# Patient Record
Sex: Female | Born: 1990 | Race: Black or African American | Hispanic: No | Marital: Single | Smoking: Current every day smoker
Health system: Southern US, Community
[De-identification: ages and names within clinical notes are randomized; demographics above are authoritative.]

## PROBLEM LIST (undated history)

## (undated) HISTORY — PX: WISDOM TOOTH EXTRACTION: SHX21

---

## 2014-04-07 ENCOUNTER — Encounter (HOSPITAL_BASED_OUTPATIENT_CLINIC_OR_DEPARTMENT_OTHER): Payer: Self-pay | Admitting: Emergency Medicine

## 2014-04-07 ENCOUNTER — Emergency Department (HOSPITAL_BASED_OUTPATIENT_CLINIC_OR_DEPARTMENT_OTHER): Payer: Self-pay

## 2014-04-07 DIAGNOSIS — W231XXA Caught, crushed, jammed, or pinched between stationary objects, initial encounter: Secondary | ICD-10-CM | POA: Insufficient documentation

## 2014-04-07 DIAGNOSIS — Y9389 Activity, other specified: Secondary | ICD-10-CM | POA: Insufficient documentation

## 2014-04-07 DIAGNOSIS — S6991XA Unspecified injury of right wrist, hand and finger(s), initial encounter: Secondary | ICD-10-CM | POA: Insufficient documentation

## 2014-04-07 DIAGNOSIS — Y998 Other external cause status: Secondary | ICD-10-CM | POA: Insufficient documentation

## 2014-04-07 DIAGNOSIS — Z720 Tobacco use: Secondary | ICD-10-CM | POA: Insufficient documentation

## 2014-04-07 DIAGNOSIS — Y9289 Other specified places as the place of occurrence of the external cause: Secondary | ICD-10-CM | POA: Insufficient documentation

## 2014-04-07 MED ORDER — IBUPROFEN 800 MG PO TABS: 800.0000 mg | ORAL_TABLET | Freq: Once | ORAL | Status: DC

## 2014-04-07 NOTE — ED Notes (Signed)
Pt reports struck elevator door with hand after hear about death of parent

## 2014-04-08 ENCOUNTER — Emergency Department (HOSPITAL_BASED_OUTPATIENT_CLINIC_OR_DEPARTMENT_OTHER)
Admission: EM | Admit: 2014-04-08 | Discharge: 2014-04-08 | Discharge status: Against Medical Advice | Payer: Self-pay | Attending: Emergency Medicine | Admitting: Emergency Medicine

## 2014-04-08 NOTE — ED Notes (Signed)
Pt asking how much longer for eval, informed md is looking at xrays that have been performed and seeing patients in fast track if xrays negative.  Pt and family after noted to be walking out of the er, nad, ambulatory.

## 2014-04-08 NOTE — ED Notes (Signed)
Call to room no answer.

## 2015-10-10 ENCOUNTER — Emergency Department (HOSPITAL_BASED_OUTPATIENT_CLINIC_OR_DEPARTMENT_OTHER)
Admission: EM | Admit: 2015-10-10 | Discharge: 2015-10-11 | Disposition: A | Discharge status: Home / Self Care | Payer: Self-pay | Attending: Emergency Medicine | Admitting: Emergency Medicine

## 2015-10-10 ENCOUNTER — Encounter (HOSPITAL_BASED_OUTPATIENT_CLINIC_OR_DEPARTMENT_OTHER): Payer: Self-pay | Admitting: Emergency Medicine

## 2015-10-10 DIAGNOSIS — H9201 Otalgia, right ear: Secondary | ICD-10-CM | POA: Insufficient documentation

## 2015-10-10 DIAGNOSIS — Z792 Long term (current) use of antibiotics: Secondary | ICD-10-CM | POA: Insufficient documentation

## 2015-10-10 DIAGNOSIS — F17200 Nicotine dependence, unspecified, uncomplicated: Secondary | ICD-10-CM | POA: Insufficient documentation

## 2015-10-10 MED ORDER — IBUPROFEN 800 MG PO TABS
800.0000 mg | ORAL_TABLET | Freq: Once | ORAL | Status: AC
  Administered 2015-10-11: 800 mg via ORAL
  Filled 2015-10-10: qty 1

## 2015-10-10 MED ORDER — LORATADINE 10 MG PO TABS: ORAL_TABLET | ORAL | 0 refills | Status: DC

## 2015-10-10 NOTE — ED Provider Notes (Signed)
[facility]   CSN: [id number] Arrival date & time: 10/10/15  2330  By signing my name below, I, Doreatha MartinEva Mathews, attest that this documentation has been prepared under the direction and in the presence of Zadie Rhineonald Bryanah Sidell, MD. Electronically Signed: Doreatha MartinEva Mathews, ED Scribe. 10/10/15. 11:54 PM.     History   Chief Complaint Chief Complaint  Patient presents with  . Otalgia    HPI Desiree House is a 25 y.o. female otherwise healthy who presents to the Emergency Department complaining of moderate right ear pain onset 2 days ago. She also notes some mild muffled hearing. Pt states she did not hear any pop before the onset of her pain, but felt a sudden sharp pain. She denies recent trauma or injury to the ear. Pt denies taking OTC medications at home to improve symptoms. She also denies drainage or bleeding from the ear, hearing loss, fever, emesis, cough, SOB, recent nasal congestion.   The history is provided by the patient. No language interpreter was used.  Otalgia  This is a new problem. The current episode started 2 days ago. There is pain in the right ear. The problem occurs constantly. The problem has not changed since onset.There has been no fever. The pain is moderate. Pertinent negatives include no ear discharge, no hearing loss, no vomiting and no cough. Her past medical history does not include hearing loss.    History reviewed. No pertinent past medical history.  There are no active problems to display for this patient.   History reviewed. No pertinent surgical history.  OB History    No data available       Home Medications    Prior to Admission medications   Medication Sig Start Date End Date Taking? Authorizing Provider  metroNIDAZOLE (FLAGYL) 500 MG tablet Take 500 mg by mouth 3 (three) times daily.   Yes Historical Provider, MD    Family History No family history on file.  Social History Social History  Substance Use Topics  .  Smoking status: Current Every Day Smoker  . Smokeless tobacco: Never Used  . Alcohol use Yes     Allergies   Review of patient's allergies indicates no known allergies.   Review of Systems Review of Systems  HENT: Positive for ear pain and tinnitus. Negative for congestion, ear discharge and hearing loss.   Respiratory: Negative for cough and shortness of breath.   Gastrointestinal: Negative for vomiting.     Physical Exam Updated Vital Signs BP 110/70 (BP Location: Left Arm)   Pulse 72   Temp 98.3 F (36.8 C) (Oral)   Resp 16   Ht 5\' 3"  (1.6 m)   Wt 126 lb (57.2 kg)   SpO2 100%   BMI 22.32 kg/m   Physical Exam CONSTITUTIONAL: Well developed/well nourished HEAD: Normocephalic/atraumatic EYES: EOMI/PERRL ENMT: Mucous membranes moist. No mastoid tenderness. Ears symmetric. Bilateral TMs intact. No erythema. Minimal effusion noted behind right TM  NECK: supple no meningeal signs CV: S1/S2 noted, no murmurs/rubs/gallops noted LUNGS: Lungs are clear to auscultation bilaterally, no apparent distress NEURO: Pt is awake/alert/appropriate, moves all extremitiesx4.  No facial droop.   SKIN: warm, color normal PSYCH: no abnormalities of mood noted, alert and oriented to situation   ED Treatments / Results   DIAGNOSTIC STUDIES: Oxygen Saturation is 100% on RA, normal by my interpretation.    COORDINATION OF CARE: 11:51 PM Discussed treatment plan with pt at bedside which includes symptomatic therapy and pt agreed to plan.  Labs (all labs ordered are listed, but only abnormal results are displayed) Labs Reviewed - No data to display  EKG  EKG Interpretation None       Radiology No results found.  Procedures Procedures   Medications Ordered in ED Medications - No data to display   Initial Impression / Assessment and Plan / ED Course  I have reviewed the triage vital signs and the nursing notes.  Pertinent labs & imaging results that were available  during my care of the patient were reviewed by me and considered in my medical decision making (see chart for details).  Clinical Course    Pt well appearing No hard signs of Otitis media/externa Exam otherwise unremarkable Advised OTC meds I did order claritin for any congestion that may be contributing to these symptoms  Final Clinical Impressions(s) / ED Diagnoses   Final diagnoses:  Otalgia, right    New Prescriptions Discharge Medication List as of 10/10/2015 11:57 PM    START taking these medications   Details  loratadine (CLARITIN) 10 MG tablet One po daily x 7 days, Print        I personally performed the services described in this documentation, which was scribed in my presence. The recorded information has been reviewed and is accurate.        Zadie Rhine, MD 10/11/15 [phone]

## 2015-10-10 NOTE — ED Triage Notes (Signed)
Pt c/o right ear pain x 2 days 

## 2015-10-10 NOTE — ED Notes (Signed)
MD at bedside. 

## 2016-12-28 ENCOUNTER — Other Ambulatory Visit: Payer: Self-pay

## 2016-12-28 ENCOUNTER — Emergency Department (HOSPITAL_BASED_OUTPATIENT_CLINIC_OR_DEPARTMENT_OTHER)
Admission: EM | Admit: 2016-12-28 | Discharge: 2016-12-28 | Disposition: A | Discharge status: Home / Self Care | Payer: Self-pay | Attending: Emergency Medicine | Admitting: Emergency Medicine

## 2016-12-28 ENCOUNTER — Encounter (HOSPITAL_BASED_OUTPATIENT_CLINIC_OR_DEPARTMENT_OTHER): Payer: Self-pay

## 2016-12-28 DIAGNOSIS — R197 Diarrhea, unspecified: Secondary | ICD-10-CM | POA: Insufficient documentation

## 2016-12-28 DIAGNOSIS — F17200 Nicotine dependence, unspecified, uncomplicated: Secondary | ICD-10-CM | POA: Insufficient documentation

## 2016-12-28 MED ORDER — LIDOCAINE (ANORECTAL) 5 % EX CREA: TOPICAL_CREAM | CUTANEOUS | 0 refills | Status: DC

## 2016-12-28 NOTE — ED Notes (Signed)
Pt went to restroom, given supplies to get a stool sample

## 2016-12-28 NOTE — ED Notes (Signed)
Pt came out of restroom, did not have a bowel movement, "just cramping."

## 2016-12-28 NOTE — ED Notes (Signed)
Came back to ED and gave stool sample as instructed and taken to lab

## 2016-12-28 NOTE — ED Provider Notes (Signed)
[facility] Provider Note: [name], MD, FACEP  CSN: [id number] MRN: [id number] ARRIVAL: 12/28/16 at 0056 ROOM: [facility]   CHIEF COMPLAINT  Diarrhea   HISTORY OF PRESENT ILLNESS  12/28/16 1:10 AM Desiree House is a 26 y.o. female with a 2-day history of diarrhea.  She describes the diarrhea as watery and profuse.  She is unable to specify how many times she has moved her bowels.  She was unable to move her bowels in the ED despite sitting in the bathroom for 15 minutes.  There has been associated abdominal cramping which is located in the lower abdomen.  She rates this cramping is a 7 out of 10 at its worst.  She denies fever, chills or dysuria.  She has had nausea but no vomiting.  She also complains of perianal irritation due to frequent bowel movements and frequent wiping.  She has taken 1 dose of Pepto-Bismol and 2 doses of loperamide without significant improvement yet.   History reviewed. No pertinent past medical history.  History reviewed. No pertinent surgical history.  No family history on file.  Social History   Tobacco Use  . Smoking status: Current Every Day Smoker  . Smokeless tobacco: Never Used  Substance Use Topics  . Alcohol use: Yes  . Drug use: No    Prior to Admission medications   Medication Sig Start Date End Date Taking? Authorizing Provider  loratadine (CLARITIN) 10 MG tablet One po daily x 7 days 10/10/15   Zadie RhineWickline, Donald, MD    Allergies Patient has no known allergies.   REVIEW OF SYSTEMS  Negative except as noted here or in the History of Present Illness.   PHYSICAL EXAMINATION  Initial Vital Signs Blood pressure (!) 138/92, pulse (!) 105, temperature 98.7 F (37.1 C), temperature source Oral, resp. rate 18, last menstrual period 12/12/2016, SpO2 99 %.  Examination General: Well-developed, well-nourished female in no acute distress; appearance consistent with age of record HENT: normocephalic; atraumatic Eyes:  pupils equal, round and reactive to light; extraocular muscles intact Neck: supple Heart: regular rate and rhythm Lungs: clear to auscultation bilaterally Abdomen: soft; nondistended; mild lower abdominal tenderness; no masses or hepatosplenomegaly; bowel sounds present Extremities: No deformity; full range of motion; pulses normal Neurologic: Awake, alert and oriented; motor function intact in all extremities and symmetric; no facial droop Skin: Warm and dry Psychiatric: Normal mood and affect   RESULTS  Summary of this visit's results, reviewed by myself:   EKG Interpretation  Date/Time:    Ventricular Rate:    PR Interval:    QRS Duration:   QT Interval:    QTC Calculation:   R Axis:     Text Interpretation:        Laboratory Studies: No results found for this or any previous visit (from the past 24 hour(s)). Imaging Studies: No results found.  ED COURSE  Nursing notes and initial vitals signs, including pulse oximetry, reviewed.  Vitals:   12/28/16 0107  BP: (!) 138/92  Pulse: (!) 105  Resp: 18  Temp: 98.7 F (37.1 C)  TempSrc: Oral  SpO2: 99%   She was advised to continue loperamide per package instructions.  We will attempt to have her return with the stool sample for analysis.  We will prescribe 5% lidocaine anorectal cream for symptomatic relief.  PROCEDURES    ED DIAGNOSES     ICD-10-CM   1. Diarrhea of presumed infectious origin R19.7        [name],  Jonny RuizJohn, MD 12/28/16 [id number]

## 2016-12-28 NOTE — ED Notes (Signed)
Pt verbalizes understanding of dc instructions.  Given collection kit for a stool sample and instructions on antidiarrheal medications as well as creams to relieve the pain in her rectal area from diarrhea. 

## 2016-12-28 NOTE — ED Triage Notes (Signed)
Pt c/o diarrhea and abdominal cramping since yesterday.  Tried one dose of pepto bismol yesterday and two doses of "antidiarrheal medicine" today, is unsure of what time this evening that she took the diarrhea medication.  Pt states she cannot count how many times she has had diarrhea.  Denies fevers and vomiting.

## 2016-12-29 LAB — GASTROINTESTINAL PANEL BY PCR, STOOL (REPLACES STOOL CULTURE)
Adenovirus F40/41: NOT DETECTED
Astrovirus: NOT DETECTED
Campylobacter species: NOT DETECTED
Cryptosporidium: NOT DETECTED
Cyclospora cayetanensis: NOT DETECTED
ENTAMOEBA HISTOLYTICA: NOT DETECTED
ENTEROAGGREGATIVE E COLI (EAEC): DETECTED — AB
ENTEROTOXIGENIC E COLI (ETEC): NOT DETECTED
Enteropathogenic E coli (EPEC): NOT DETECTED
GIARDIA LAMBLIA: NOT DETECTED
NOROVIRUS GI/GII: NOT DETECTED
Plesimonas shigelloides: NOT DETECTED
ROTAVIRUS A: NOT DETECTED
Salmonella species: DETECTED — AB
Sapovirus (I, II, IV, and V): NOT DETECTED
Shiga like toxin producing E coli (STEC): NOT DETECTED
Shigella/Enteroinvasive E coli (EIEC): NOT DETECTED
VIBRIO CHOLERAE: NOT DETECTED
Vibrio species: NOT DETECTED
Yersinia enterocolitica: NOT DETECTED

## 2017-01-01 ENCOUNTER — Telehealth (HOSPITAL_BASED_OUTPATIENT_CLINIC_OR_DEPARTMENT_OTHER): Payer: Self-pay | Admitting: Emergency Medicine

## 2017-01-18 ENCOUNTER — Encounter (HOSPITAL_BASED_OUTPATIENT_CLINIC_OR_DEPARTMENT_OTHER): Payer: Self-pay | Admitting: Emergency Medicine

## 2017-01-18 ENCOUNTER — Other Ambulatory Visit: Payer: Self-pay

## 2017-01-18 ENCOUNTER — Emergency Department (HOSPITAL_BASED_OUTPATIENT_CLINIC_OR_DEPARTMENT_OTHER)
Admission: EM | Admit: 2017-01-18 | Discharge: 2017-01-18 | Disposition: A | Discharge status: Home / Self Care | Payer: Self-pay | Attending: Emergency Medicine | Admitting: Emergency Medicine

## 2017-01-18 ENCOUNTER — Emergency Department (HOSPITAL_BASED_OUTPATIENT_CLINIC_OR_DEPARTMENT_OTHER): Payer: Self-pay

## 2017-01-18 DIAGNOSIS — B9789 Other viral agents as the cause of diseases classified elsewhere: Secondary | ICD-10-CM | POA: Insufficient documentation

## 2017-01-18 DIAGNOSIS — F17200 Nicotine dependence, unspecified, uncomplicated: Secondary | ICD-10-CM | POA: Insufficient documentation

## 2017-01-18 DIAGNOSIS — J069 Acute upper respiratory infection, unspecified: Secondary | ICD-10-CM | POA: Insufficient documentation

## 2017-01-18 LAB — RAPID STREP SCREEN (MED CTR MEBANE ONLY): Streptococcus, Group A Screen (Direct): NEGATIVE

## 2017-01-18 MED ORDER — FLUTICASONE PROPIONATE 50 MCG/ACT NA SUSP: 1.0000 | Freq: Every day | NASAL | 0 refills | Status: DC

## 2017-01-18 MED ORDER — PROMETHAZINE-DM 6.25-15 MG/5ML PO SYRP: 5.0000 mL | ORAL_SOLUTION | Freq: Four times a day (QID) | ORAL | 0 refills | Status: DC | PRN

## 2017-01-18 NOTE — ED Provider Notes (Signed)
[facility] EMERGENCY DEPARTMENT Provider Note   CSN: [id number] Arrival date & time: 01/18/17  1823     History   Chief Complaint Chief Complaint  Patient presents with  . Cough    HPI Desiree House is a 26 y.o. female presenting with cough.  Patient states that for the past 2 days, she has had a nonproductive cough and associated nasal congestion.  She denies fevers, chills, eye pain, ear pain, sore throat, chest pain, shortness of breath, nausea, vomiting, abdominal pain.  She states that she had a coworker who had a positive flu test.  She had a flu shot earlier this year.  She denies generalized body aches.  She has no other medical problems, does not take any medications daily.  She has not tried anything for her symptoms so far.  HPI  History reviewed. No pertinent past medical history.  There are no active problems to display for this patient.   History reviewed. No pertinent surgical history.  OB History    No data available       Home Medications    Prior to Admission medications   Medication Sig Start Date End Date Taking? Authorizing Provider  fluticasone (FLONASE) 50 MCG/ACT nasal spray Place 1 spray into both nostrils daily. 01/18/17   Daisja Kessinger, PA-C  Lidocaine, Anorectal, 5 % CREA Apply to perianal region as needed for discomfort. 12/28/16   Molpus, John, MD  promethazine-dextromethorphan (PROMETHAZINE-DM) 6.25-15 MG/5ML syrup Take 5 mLs by mouth 4 (four) times daily as needed for cough. 01/18/17   Laneya Gasaway, PA-C    Family History History reviewed. No pertinent family history.  Social History Social History   Tobacco Use  . Smoking status: Current Every Day Smoker  . Smokeless tobacco: Never Used  Substance Use Topics  . Alcohol use: Yes  . Drug use: No     Allergies   Patient has no known allergies.   Review of Systems Review of Systems  Constitutional: Negative for chills and fever.  HENT: Positive for  congestion. Negative for ear pain, sinus pressure, sinus pain and sore throat.   Respiratory: Positive for cough. Negative for chest tightness and shortness of breath.   Cardiovascular: Negative for chest pain.     Physical Exam Updated Vital Signs BP (!) 118/92 (BP Location: Left Arm)   Pulse 94   Temp 99 F (37.2 C) (Oral)   Resp 18   Ht 5\' 2"  (1.575 m)   Wt 57.2 kg (126 lb)   LMP 01/14/2017   SpO2 100%   BMI 23.05 kg/m   Physical Exam  Constitutional: She is oriented to person, place, and time. She appears well-developed and well-nourished. No distress.  HENT:  Head: Normocephalic and atraumatic.  Right Ear: Tympanic membrane, external ear and ear canal normal.  Left Ear: Tympanic membrane, external ear and ear canal normal.  Nose: Mucosal edema present. Right sinus exhibits maxillary sinus tenderness. Left sinus exhibits maxillary sinus tenderness.  Mouth/Throat: Uvula is midline, oropharynx is clear and moist and mucous membranes are normal. No tonsillar exudate.  Nasal mucosal edema.  Tenderness palpation of maxillary sinuses.  OP clear without tonsillar swelling or exudate.  Eyes: Conjunctivae and EOM are normal. Pupils are equal, round, and reactive to light.  Neck: Normal range of motion.  Cardiovascular: Normal rate, regular rhythm and intact distal pulses.  Pulmonary/Chest: Effort normal and breath sounds normal. No respiratory distress. She has no decreased breath sounds. She has no wheezes. She has  no rhonchi. She has no rales.  Pt speaking in full sentences. Clear lung sounds in all fields  Abdominal: Soft. She exhibits no distension. There is no tenderness.  Musculoskeletal: Normal range of motion.  Lymphadenopathy:    She has no cervical adenopathy.  Neurological: She is alert and oriented to person, place, and time.  Skin: Skin is warm.  Psychiatric: She has a normal mood and affect.  Nursing note and vitals reviewed.    ED Treatments / Results   Labs (all labs ordered are listed, but only abnormal results are displayed) Labs Reviewed  RAPID STREP SCREEN (NOT [facility])  CULTURE, GROUP A STREP [facility])    EKG  EKG Interpretation None       Radiology Dg Chest 2 View  Result Date: 01/18/2017 CLINICAL DATA:  Cough, chest pain and shortness of breath for 2 days. History of bronchitis. EXAM: CHEST  2 VIEW COMPARISON:  None. FINDINGS: Cardiomediastinal silhouette is normal. No pleural effusions or focal consolidations. Trachea projects midline and there is no pneumothorax. Soft tissue planes and included osseous structures are non-suspicious. IMPRESSION: Normal chest. Electronically Signed   By: Awilda Metroourtnay  Bloomer M.D.   On: 01/18/2017 19:28    Procedures Procedures (including critical care time)  Medications Ordered in ED Medications - No data to display   Initial Impression / Assessment and Plan / ED Course  I have reviewed the triage vital signs and the nursing notes.  Pertinent labs & imaging results that were available during my care of the patient were reviewed by me and considered in my medical decision making (see chart for details).     Patient presenting with URI symptoms.  Physical exam reassuring, patient is afebrile and appears nontoxic.  Pulmonary exam reassuring.  Doubt pneumonia, strep, other bacterial infection, or peritonsillar abscess. CXR and strep negative. Pt requesting flu swab, knowing that she will not get results today and it will not change management.   Likely URI.  Will treat symptomatically.  Patient to follow-up with primary care as needed.  At this time, patient appears safe for discharge.  Return precautions given.  Patient states they understand and agrees to plan.  RN reports patient declined flu swab when she realized it was a nasal swab.  Final Clinical Impressions(s) / ED Diagnoses   Final diagnoses:  Viral URI with cough    ED Discharge Orders        Ordered    fluticasone (FLONASE)  50 MCG/ACT nasal spray  Daily     01/18/17 2157    promethazine-dextromethorphan (PROMETHAZINE-DM) 6.25-15 MG/5ML syrup  4 times daily PRN     01/18/17 2157       Alveria ApleyCaccavale, Modine Oppenheimer, PA-C 01/19/17 0130    Melene PlanFloyd, Dan, DO 01/20/17 1534

## 2017-01-18 NOTE — ED Notes (Signed)
Pt refused flu swab at discharge, verbalizes understanding of dc instructions and denies any further needs at this time

## 2017-01-18 NOTE — ED Notes (Signed)
EDP is now in room

## 2017-01-18 NOTE — ED Triage Notes (Signed)
Patient states that for the last 2 days she has had a cough - she was exposed to someone with flu 2 days ago

## 2017-01-18 NOTE — Discharge Instructions (Signed)
You likely have a viral illness.  This will be treated symptomatically. Use Tylenol or ibuprofen as needed for fevers or body aches. Use Flonase daily for nasal congestion and cough. Take cough syrup as needed. Make sure you stay well-hydrated with water. Wash your hands frequently to prevent spread of infection. Follow-up with your primary care doctor in 1 week if your symptoms are not improving. Return to the emergency room if you develop chest pain, difficulty breathing, or any new or worsening symptoms.

## 2017-01-21 LAB — CULTURE, GROUP A STREP (THRC)

## 2018-01-24 ENCOUNTER — Emergency Department (HOSPITAL_BASED_OUTPATIENT_CLINIC_OR_DEPARTMENT_OTHER)
Admission: EM | Admit: 2018-01-24 | Discharge: 2018-01-24 | Disposition: A | Discharge status: Home / Self Care | Payer: Self-pay | Attending: Emergency Medicine | Admitting: Emergency Medicine

## 2018-01-24 ENCOUNTER — Encounter (HOSPITAL_BASED_OUTPATIENT_CLINIC_OR_DEPARTMENT_OTHER): Payer: Self-pay | Admitting: Emergency Medicine

## 2018-01-24 ENCOUNTER — Other Ambulatory Visit: Payer: Self-pay

## 2018-01-24 DIAGNOSIS — R059 Cough, unspecified: Secondary | ICD-10-CM

## 2018-01-24 DIAGNOSIS — R0981 Nasal congestion: Secondary | ICD-10-CM | POA: Insufficient documentation

## 2018-01-24 DIAGNOSIS — F17210 Nicotine dependence, cigarettes, uncomplicated: Secondary | ICD-10-CM | POA: Insufficient documentation

## 2018-01-24 DIAGNOSIS — R05 Cough: Secondary | ICD-10-CM | POA: Insufficient documentation

## 2018-01-24 MED ORDER — BENZONATATE 100 MG PO CAPS: 100.0000 mg | ORAL_CAPSULE | Freq: Three times a day (TID) | ORAL | 0 refills | Status: DC

## 2018-01-24 NOTE — Discharge Instructions (Signed)
At this time you appear to have a viral upper respiratory infection.  Treatment of symptoms is the best plan.  This includes tylenol or motrin for aches and pains, gargling with salt water for sore throat, increased fluid intake, especially hot drinks to soothe the throat.  Over the counter medications that include decongestants and cough suppressants may also help.  Expect to have symptoms for 5-10 days.  Return to the ER for worsening condition or new concerning symptoms. ° °1. Medications: tessalon for cough, usual home medications °2. Treatment: rest, drink plenty of fluids, take tylenol or ibuprofen for fever control °3. Follow Up: Please follow up with your primary doctor in 3 days for discussion of your diagnoses and further evaluation after today's visit; if you do not have a primary care doctor use the resource guide provided to find one; Return to the ER for high fevers, difficulty breathing or other concerning symptoms ° °Continue to stay well-hydrated. Gargle warm salt water and spit it out for sore throat. Take benadryl or other antihistamine to decrease secretions and for watery itchy eyes. Continued to alternate between Tylenol and ibuprofen for pain. Follow up with your primary care doctor in 5-7 days or referral for urgent care for recheck of ongoing symptoms however return to emergency department for emergent changing or worsening of symptoms. ° ° Read the instructions below on reasons to return to the emergency department and to learn more about your diagnosis.  Use over the counter medications for symptomatic relief as we discussed (mucinex as a decongestant, Tylenol for fever/pain, Motrin/Ibuprofen for muscle aches). If prescribed a cough suppressant during your visit, do not operate heavy machinery with in 5 hours of taking this medication. Follow up with your primary care doctor in 4 days if your symptoms persist.  Your more than welcome to return to the emergency department if symptoms worsen  or become concerning. ° °Upper Respiratory Infection, Adult  °An upper respiratory infection (URI) is also sometimes known as the common cold. Most people improve within 1 week, but symptoms can last up to 2 weeks. A residual cough may last even longer.  ° °URI is most commonly caused by a virus. Viruses are NOT treated with antibiotics. You can easily spread the virus to others by oral contact. This includes kissing, sharing a glass, coughing, or sneezing. Touching your mouth or nose and then touching a surface, which is then touched by another person, can also spread the virus.  ° °TREATMENT  °Treatment is directed at relieving symptoms. There is no cure. Antibiotics are not effective, because the infection is caused by a virus, not by bacteria. Treatment may include:  °Increased fluid intake. Sports drinks offer valuable electrolytes, sugars, and fluids.  °Breathing heated mist or steam (vaporizer or shower).  °Eating chicken soup or other clear broths, and maintaining good nutrition.  °Getting plenty of rest.  °Using gargles or lozenges for comfort.  °Controlling fevers with ibuprofen or acetaminophen as directed by your caregiver.  °Increasing usage of your inhaler if you have asthma.  °Return to work when your temperature has returned to normal.  ° °SEEK MEDICAL CARE IF:  °After the first few days, you feel you are getting worse rather than better.  °You develop worsening shortness of breath, or brown or red sputum. These may be signs of pneumonia.  °You develop yellow or brown nasal discharge or pain in the face, especially when you bend forward. These may be signs of sinusitis.  °You develop a   fever, swollen neck glands, pain with swallowing, or white areas in the back of your throat. These may be signs of strep throat.  Severe headache or stiff neck. Uncontrolled vomiting. Lightheadedness, feeling faint, or if you pass out. Problems breathing or shortness of breath. Weakness or inability to walk. Any  other concerning symptoms or concerns. If not improving over 7 days or if feeling worse at any time.  RESOURCE GUIDE  Dental Problems  Patients with Medicaid: Merna Lawrence Cisco Phone:  [phone]                                                                             Phone:  [phone]  If unable to pay or uninsured, contact:  [facility] or [facility]. to become qualified for the adult dental clinic.  Chronic Pain Problems Contact [facility]  [phone] Patients need to be referred by their primary care doctor.  Insufficient Money for Medicine Contact [employer]:  call "211" or [place] [phone].  No Primary Care Doctor Call [facility]  [phone] Other agencies that provide inexpensive medical care    [place]  [phone]    [facility]  [place]  [phone]    [facility]  [phone]    [facility]  [place]  [phone] [facility]  [phone] [place]   [phone] (emergency services [phone])  Abuse/Neglect [place] [phone] [place] [phone] (After Hours)  Emergency [place] [phone]  [place] at the [place] [phone] [place] [phone]  MRSA Hotline #:   (407)003-7778  [facility] Dept. [address]          [place]  Sela Hua Phone:  [phone]                                     Phone:  [phone]                   Phone:  [place] Phone:  [place] [phone] (After Hours)   If you develop symptoms of Shortness of Breath, Chest Pain, Swelling of lips, mouth or tongue or if your condition becomes worse with any new symptoms, see your doctor or return to the Emergency Department for immediate care. Emergency services are not intended to be a substitute for comprehensive medical attention.  Please contact your doctor for follow up if not improving as expected.   Call your doctor in 5-7 days or as directed if there is no improvement.   Community Resources: *IF YOU ARE IN IMMEDIATE DANGER CALL 911!  Abuse/Neglect:  Family Services Crisis Hotline [facility]): 639-815-8762 [facility] Against Violence [facility]): [phone]  After hours, holidays and weekends: [phone] National Domestic Violence Hotline: [place]: [place]: Dannielle Karvonen: [phone]  Health Clinics:  Urgent [place] ([place]): [phone] Monday - Friday 8 AM - 9 PM, Saturday and Sunday 10 AM - 9 PM  [facility] [place]: [phone] Monday - Friday 8 AM - 5 PM  [facility]  [address]: [phone] Monday- Friday 8:30 AM - 5:30 PM, Sat 9 AM - 1 PM  24 HR [place] [facility] [address]: [phone] [facility] [address]: [phone] [facility] [address]: [phone]  24 [facility] [place] [facility]: [address] [phone]  Cultures: If culture results are positive, we will notify you if a change in treatment is necessary.  LABORATORY TESTS:         If you had any labs drawn in the ED that have not resulted by the time you are  discharged home, we will review these lab results and the treatment given to you.  If there is any further treatment or notification needed, we will contact you by phone, or letter.  "PLEASE ENSURE THAT YOU HAVE GIVEN US YOUR CURRENT WORKING PHONE NUMBER AND YOUR CURRENT ADDRESS, so that we can contact you if needed."  RADIOLOGY TESTS:  If the referred physician wants todays x-rays, please call the hospitals Radiology Department the day before your doctors appointment. [place]     [phone] [place]   [phone] [place]     [phone]  Our doctors and staff appreciate your choosing [place] for your emergency medical care needs. We are here to serve you.

## 2018-01-24 NOTE — ED Triage Notes (Signed)
Pt c/o coughing x 1 week. Pt tried over the counter medication without relief.

## 2018-01-24 NOTE — ED Provider Notes (Signed)
[facility] EMERGENCY DEPARTMENT Provider Note   CSN: [id number] Arrival date & time: 01/24/18  1835     History   Chief Complaint Chief Complaint  Patient presents with  . Cough    HPI Desiree House is a 27 y.o. female presents with an intermittent productive cough, sneezing, congestion, and rhinorrhea onset 1 week ago. Patient states she has tried multiple over the counter remedies without relief. Patient states nothing makes the pain better. Patient states she had a fever initially, but fever has resolved. Patient reports intermittent chest tightness with coughing. Patient denies sore throat, body aches, abdominal pain, nausea, vomiting, or diarrhea. Patient denies shortness of breath or chest pain. Patient reports sick exposures at work. Patient states symptoms have improved over time.   HPI  History reviewed. No pertinent past medical history.  There are no active problems to display for this patient.   Past Surgical History:  Procedure Laterality Date  . WISDOM TOOTH EXTRACTION       OB History   No obstetric history on file.      Home Medications    Prior to Admission medications   Medication Sig Start Date End Date Taking? Authorizing Provider  benzonatate (TESSALON) 100 MG capsule Take 1 capsule (100 mg total) by mouth every 8 (eight) hours. 01/24/18   Carlyle BasquesHernandez, Fabiana Dromgoole P, PA-C  fluticasone (FLONASE) 50 MCG/ACT nasal spray Place 1 spray into both nostrils daily. 01/18/17   Caccavale, Sophia, PA-C  Lidocaine, Anorectal, 5 % CREA Apply to perianal region as needed for discomfort. 12/28/16   Molpus, John, MD  promethazine-dextromethorphan (PROMETHAZINE-DM) 6.25-15 MG/5ML syrup Take 5 mLs by mouth 4 (four) times daily as needed for cough. 01/18/17   Caccavale, Sophia, PA-C    Family History No family history on file.  Social History Social History   Tobacco Use  . Smoking status: Current Every Day Smoker  . Smokeless tobacco: Never Used    Substance Use Topics  . Alcohol use: Yes  . Drug use: No     Allergies   Patient has no known allergies.   Review of Systems Review of Systems  Constitutional: Negative for chills, diaphoresis and fever.  HENT: Positive for congestion and rhinorrhea. Negative for ear pain and sore throat.   Eyes: Negative for pain.  Respiratory: Positive for cough and chest tightness. Negative for shortness of breath.   Cardiovascular: Negative for chest pain.  Gastrointestinal: Negative for abdominal pain, nausea and vomiting.  Endocrine: Negative for cold intolerance and heat intolerance.  Genitourinary: Negative for dysuria.  Musculoskeletal: Negative for back pain.  Skin: Negative for rash.  Allergic/Immunologic: Negative for immunocompromised state.  Neurological: Negative for headaches.  Hematological: Negative for adenopathy.     Physical Exam Updated Vital Signs BP 123/83 (BP Location: Right Arm)   Pulse 90   Temp 99 F (37.2 C) (Oral)   Resp 20   Ht 5\' 2"  (1.575 m)   Wt 77.1 kg   LMP 12/16/2017   SpO2 100%   BMI 31.09 kg/m   Physical Exam Vitals signs and nursing note reviewed.  Constitutional:      General: She is not in acute distress.    Appearance: She is well-developed. She is not diaphoretic.  HENT:     Head: Normocephalic and atraumatic.     Right Ear: Tympanic membrane, ear canal and external ear normal.     Left Ear: Tympanic membrane, ear canal and external ear normal.     Nose: Congestion and  rhinorrhea present.     Mouth/Throat:     Pharynx: Posterior oropharyngeal erythema present. No oropharyngeal exudate.  Neck:     Musculoskeletal: Normal range of motion and neck supple.  Cardiovascular:     Rate and Rhythm: Normal rate and regular rhythm.     Heart sounds: Normal heart sounds. No murmur. No friction rub. No gallop.   Pulmonary:     Effort: Pulmonary effort is normal. No respiratory distress.     Breath sounds: Normal breath sounds. No wheezing  or rales.  Abdominal:     Palpations: Abdomen is soft.     Tenderness: There is no abdominal tenderness.  Musculoskeletal: Normal range of motion.  Skin:    General: Skin is warm.     Findings: No erythema or rash.  Neurological:     Mental Status: She is alert.      ED Treatments / Results  Labs (all labs ordered are listed, but only abnormal results are displayed) Labs Reviewed - No data to display  EKG None  Radiology No results found.  Procedures Procedures (including critical care time)  Medications Ordered in ED Medications - No data to display   Initial Impression / Assessment and Plan / ED Course  I have reviewed the triage vital signs and the nursing notes.  Pertinent labs & imaging results that were available during my care of the patient were reviewed by me and considered in my medical decision making (see chart for details).    Patients symptoms are consistent with URI, likely viral etiology. Discussed that antibiotics are not indicated for viral infections. Pt will be discharged with symptomatic treatment.  Verbalizes understanding and is agreeable with plan. Advised patient to return if she experiences new or worsening symptoms. Pt is hemodynamically stable & in NAD prior to dc.  Final Clinical Impressions(s) / ED Diagnoses   Final diagnoses:  Cough    ED Discharge Orders         Ordered    benzonatate (TESSALON) 100 MG capsule  Every 8 hours     01/24/18 2112           Leretha DykesHernandez, Gervis Gaba P, [place] JerseyPA-C 01/24/18 2114    Gwyneth SproutPlunkett, Whitney, MD 01/25/18 [phone]

## 2019-04-02 ENCOUNTER — Other Ambulatory Visit: Payer: Self-pay

## 2019-04-02 ENCOUNTER — Emergency Department (HOSPITAL_BASED_OUTPATIENT_CLINIC_OR_DEPARTMENT_OTHER)
Admission: EM | Admit: 2019-04-02 | Discharge: 2019-04-02 | Disposition: A | Discharge status: Home / Self Care | Payer: HRSA Program | Attending: Emergency Medicine | Admitting: Emergency Medicine

## 2019-04-02 ENCOUNTER — Encounter (HOSPITAL_BASED_OUTPATIENT_CLINIC_OR_DEPARTMENT_OTHER): Payer: Self-pay | Admitting: *Deleted

## 2019-04-02 ENCOUNTER — Emergency Department (HOSPITAL_BASED_OUTPATIENT_CLINIC_OR_DEPARTMENT_OTHER): Payer: HRSA Program

## 2019-04-02 DIAGNOSIS — U071 COVID-19: Secondary | ICD-10-CM | POA: Insufficient documentation

## 2019-04-02 DIAGNOSIS — Z20822 Contact with and (suspected) exposure to covid-19: Secondary | ICD-10-CM

## 2019-04-02 DIAGNOSIS — F17210 Nicotine dependence, cigarettes, uncomplicated: Secondary | ICD-10-CM | POA: Insufficient documentation

## 2019-04-02 DIAGNOSIS — R05 Cough: Secondary | ICD-10-CM | POA: Diagnosis present

## 2019-04-02 DIAGNOSIS — J069 Acute upper respiratory infection, unspecified: Secondary | ICD-10-CM

## 2019-04-02 LAB — SARS CORONAVIRUS 2 (TAT 6-24 HRS): SARS Coronavirus 2: POSITIVE — AB

## 2019-04-02 LAB — SARS CORONAVIRUS 2 AG (30 MIN TAT): SARS Coronavirus 2 Ag: NEGATIVE

## 2019-04-02 LAB — PREGNANCY, URINE: Preg Test, Ur: NEGATIVE

## 2019-04-02 MED ORDER — BENZONATATE 100 MG PO CAPS: 100.0000 mg | ORAL_CAPSULE | Freq: Three times a day (TID) | ORAL | 0 refills | Status: DC

## 2019-04-02 NOTE — Discharge Instructions (Signed)
You have been diagnosed today with viral upper respiratory tract infection with cough, suspected COVID-19 viral infection.  At this time there does not appear to be the presence of an emergent medical condition, however there is always the potential for conditions to change. Please read and follow the below instructions.  Please return to the Emergency Department immediately for any new or worsening symptoms. Please be sure to follow up with your Primary Care Provider within one week regarding your visit today; please call their office to schedule an appointment even if you are feeling better for a follow-up visit. Your Covid test is currently pending please check your results on your MyChart account in 1-2 days and discuss them with your primary care provider and schedule a follow-up visit.  Please drink plenty of water and get plenty of rest.  Please continue to quarantine until 10 days after symptom onset.  Wear mask to avoid spread of infection.  Get help right away if: You have shortness of breath You have very bad or constant: Headache. Ear pain. Pain in your forehead, behind your eyes, and over your cheekbones (sinus pain). Chest pain. You have long-lasting (chronic) lung disease along with any of these: Wheezing. Long-lasting cough. Coughing up blood. A change in your usual mucus. You have a stiff neck. You have changes in your: Vision. Hearing. Thinking. Mood. You have any new/concerning or worsening symptoms.  Please read the additional information packets attached to your discharge summary.  Do not take your medicine if  develop an itchy rash, swelling in your mouth or lips, or difficulty breathing; call 911 and seek immediate emergency medical attention if this occurs.  Note: Portions of this text may have been transcribed using voice recognition software. Every effort was made to ensure accuracy; however, inadvertent computerized transcription errors may still be  present.

## 2019-04-02 NOTE — ED Triage Notes (Signed)
C/o cough x 3 days productive clear mucus, started as sneezing and runny nose  Then cough developed denies fever, no generalized body ache

## 2019-04-02 NOTE — ED Provider Notes (Signed)
[facility] EMERGENCY DEPARTMENT Provider Note   CSN: [id number] Arrival date & time: 04/02/19  1962     History Chief Complaint  Patient presents with  . Cough    Desiree House is a 29 y.o. female otherwise healthy no daily medication use presents today for a 3-day history of URI type symptoms.  She reports that her boyfriend was sick last week with a similar illness.  3 days ago she developed sneezing and clear rhinorrhea.  She reports the sneezing ceased after 1 day and rhinorrhea continued, over the past 2 days she has developed a mild cough productive with clear mucus.  She denies any other symptoms.  Denies fever/chills, headache, sore throat, neck pain/stiffness, facial swelling, chest pain/shortness of breath, hemoptysis, abdominal pain, nausea/vomiting, diarrhea, dysuria/hematuria, extremity swelling/color change or additional concerns. HPI     History reviewed. No pertinent past medical history.  There are no problems to display for this patient.   Past Surgical History:  Procedure Laterality Date  . WISDOM TOOTH EXTRACTION       OB History   No obstetric history on file.     No family history on file.  Social History   Tobacco Use  . Smoking status: Current Every Day Smoker  . Smokeless tobacco: Never Used  Substance Use Topics  . Alcohol use: Yes  . Drug use: No    Home Medications Prior to Admission medications   Medication Sig Start Date End Date Taking? Authorizing Provider  Lidocaine, Anorectal, 5 % CREA Apply to perianal region as needed for discomfort. 12/28/16  Yes [provider]  benzonatate (TESSALON) 100 MG capsule Take 1 capsule (100 mg total) by mouth every 8 (eight) hours. 04/02/19   Bill Salinas, PA-C    Allergies    Patient has no known allergies.  Review of Systems   Review of Systems Ten systems are reviewed and are negative for acute change except as noted in the HPI  Physical Exam Updated Vital  Signs BP 124/74 (BP Location: Right Arm)   Pulse 92   Temp 98.3 F (36.8 C) (Oral)   Resp 18   Ht 5\' 3"  (1.6 m)   Wt 78.5 kg   SpO2 99%   BMI 30.65 kg/m   Physical Exam Constitutional:      General: She is not in acute distress.    Appearance: Normal appearance. She is well-developed. She is not ill-appearing or diaphoretic.  HENT:     Head: Normocephalic and atraumatic.     Jaw: There is normal jaw occlusion.     Right Ear: External ear normal.     Left Ear: External ear normal.     Nose: Rhinorrhea present. Rhinorrhea is clear.     Right Sinus: No maxillary sinus tenderness or frontal sinus tenderness.     Left Sinus: No maxillary sinus tenderness or frontal sinus tenderness.     Mouth/Throat:     Mouth: Mucous membranes are moist.     Pharynx: Oropharynx is clear.  Eyes:     General: Vision grossly intact. Gaze aligned appropriately.     Extraocular Movements: Extraocular movements intact.     Conjunctiva/sclera: Conjunctivae normal.     Pupils: Pupils are equal, round, and reactive to light.  Neck:     Trachea: Trachea and phonation normal. No tracheal tenderness or tracheal deviation.  Cardiovascular:     Rate and Rhythm: Normal rate and regular rhythm.     Heart sounds: Normal heart sounds.  Pulmonary:     Effort: Pulmonary effort is normal. No accessory muscle usage or respiratory distress.     Breath sounds: Normal breath sounds and air entry.  Abdominal:     General: There is no distension.     Palpations: Abdomen is soft.     Tenderness: There is no abdominal tenderness. There is no guarding or rebound.  Musculoskeletal:        General: No tenderness. Normal range of motion.     Cervical back: Full passive range of motion without pain, normal range of motion and neck supple.     Right lower leg: No edema.     Left lower leg: No edema.  Skin:    General: Skin is warm and dry.  Neurological:     Mental Status: She is alert.     GCS: GCS eye subscore is 4.  GCS verbal subscore is 5. GCS motor subscore is 6.     Comments: Speech is clear and goal oriented, follows commands Major Cranial nerves without deficit, no facial droop Moves extremities without ataxia, coordination intact Normal gait  Psychiatric:        Behavior: Behavior normal.     ED Results / Procedures / Treatments   Labs (all labs ordered are listed, but only abnormal results are displayed) Labs Reviewed  SARS CORONAVIRUS 2 AG (30 MIN TAT)  SARS CORONAVIRUS 2 (TAT 6-24 HRS)  PREGNANCY, URINE    EKG None  Radiology DG Chest Portable 1 View  Result Date: 04/02/2019 CLINICAL DATA:  Cough. EXAM: PORTABLE CHEST 1 VIEW COMPARISON:  January 18, 2017. FINDINGS: The heart size and mediastinal contours are within normal limits. Both lungs are clear. The visualized skeletal structures are unremarkable. IMPRESSION: No active disease. Electronically Signed   By: Lupita Raider M.D.   On: 04/02/2019 10:25    Procedures Procedures (including critical care time)  Medications Ordered in ED Medications - No data to display  ED Course  I have reviewed the triage vital signs and the nursing notes.  Pertinent labs & imaging results that were available during my care of the patient were reviewed by me and considered in my medical decision making (see chart for details).    MDM Rules/Calculators/A&P                     29 year old otherwise healthy female presents today for runny nose, sneezing and mild cough productive with clear sputum over the past 3 days.  Her boyfriend was sick with similar symptoms last week.  She denies any fever/chills, sore throat, chest pain or shortness of breath.  On examination she is well-appearing in no acute distress, cranial nerves intact, airway clear, no sinus swelling/tenderness, no meningeal signs, heart regular rate and rhythm, lungs clear to auscultation bilaterally, abdomen soft nontender without peritoneal signs, neurovascular intact to all 4  extremities without evidence of DVT.  Patient's symptoms today are consistent with a mild viral upper respiratory tract infection.  Will obtain chest x-ray and Covid test. - Pregnancy test negative Covid test is negative Chest x-ray:   IMPRESSION:  No active disease.  I have personally reviewed patient's chest x-ray and agree with radiologist interpretation. - Rapid Covid test was negative, however due to its low sensitivity will perform outpatient Covid test.  Patient is aware to follow-up on her test results via her MyChart account in the next 2 days and discuss those results with her primary care provider.  Encourage patient to  continue to quarantine and wear mask.  Will give her a work note for the next 7 days, 10 days after initial onset of symptoms.  I have high suspicion for COVID-19 viral infection in this patient given current pandemic.  There is no evidence of a bacterial infection today to necessitate antibiotics.  Additionally history, exam and work-up is not concerning for a PE, ACS, dissection or other acute cardiopulmonary etiology of symptoms.  Additionally no evidence of bacterial pneumonia, sinusitis or other emergent pathology requiring further ED work-up at this time.  She has a primary care provider who she will follow-up with.  We will treat patient's cough with Tessalon and encourage home symptomatic therapies and hydration.  Vital signs are stable without tachycardia or hypoxia on room air.  She is ambulating around the room without assistance or difficulty and is requesting discharge.  At this time there does not appear to be any evidence of an acute emergency medical condition and the patient appears stable for discharge with appropriate outpatient follow up. Diagnosis was discussed with patient who verbalizes understanding of care plan and is agreeable to discharge. I have discussed return precautions with patient who verbalizes understanding of return precautions. Patient  encouraged to follow-up with their PCP. All questions answered.  Patient has been discharged in good condition.  Patient's case discussed with Dr. Sedonia Small who agrees with plan to discharge with follow-up.   Note: Portions of this report may have been transcribed using voice recognition software. Every effort was made to ensure accuracy; however, inadvertent computerized transcription errors may still be present. Final Clinical Impression(s) / ED Diagnoses Final diagnoses:  Viral URI with cough  Suspected COVID-19 virus infection    Rx / DC Orders ED Discharge Orders         Ordered    benzonatate (TESSALON) 100 MG capsule  Every 8 hours     04/02/19 1225           Gari Crown 04/02/19 1225    Maudie Flakes, MD 04/03/19 [phone]

## 2019-07-11 ENCOUNTER — Encounter (HOSPITAL_BASED_OUTPATIENT_CLINIC_OR_DEPARTMENT_OTHER): Payer: Self-pay | Admitting: Emergency Medicine

## 2019-07-11 ENCOUNTER — Other Ambulatory Visit: Payer: Self-pay

## 2019-07-11 ENCOUNTER — Emergency Department (HOSPITAL_BASED_OUTPATIENT_CLINIC_OR_DEPARTMENT_OTHER)
Admission: EM | Admit: 2019-07-11 | Discharge: 2019-07-11 | Disposition: A | Discharge status: Home / Self Care | Payer: Medicaid Other | Attending: Emergency Medicine | Admitting: Emergency Medicine

## 2019-07-11 DIAGNOSIS — J069 Acute upper respiratory infection, unspecified: Secondary | ICD-10-CM | POA: Insufficient documentation

## 2019-07-11 DIAGNOSIS — F17200 Nicotine dependence, unspecified, uncomplicated: Secondary | ICD-10-CM | POA: Insufficient documentation

## 2019-07-11 DIAGNOSIS — Z79899 Other long term (current) drug therapy: Secondary | ICD-10-CM | POA: Insufficient documentation

## 2019-07-11 NOTE — Discharge Instructions (Addendum)
Take over-the-counter medications as needed for symptom relief.  Drink plenty of fluids and get plenty of rest.  Return to the ER if you develop severe chest pain, difficulty breathing, or other new and concerning symptoms.

## 2019-07-11 NOTE — ED Provider Notes (Signed)
[facility] EMERGENCY DEPARTMENT Provider Note   CSN: [id number] Arrival date & time: 07/11/19  1014     History Chief Complaint  Patient presents with  . Cough    Desiree House is a 29 y.o. female.  Patient is a 29 year old female with no significant past medical history.  She presents today for evaluation of URI symptoms.  Patient developed scratchy throat, cough, and congestion 5 days ago and is worsening.  Her cough has been nonproductive.  She denies fevers or chills.  She denies ill contacts.  Patient did test positive for COVID-19 in March of this year and has recovered without issue.  She denies any ill contacts or exposures.  The history is provided by the patient.  Cough Cough characteristics:  Non-productive Severity:  Moderate Onset quality:  Gradual Duration:  5 days Timing:  Constant Progression:  Worsening Relieved by:  Nothing Worsened by:  Nothing Ineffective treatments:  None tried      History reviewed. No pertinent past medical history.  There are no problems to display for this patient.   Past Surgical History:  Procedure Laterality Date  . WISDOM TOOTH EXTRACTION       OB History   No obstetric history on file.     No family history on file.  Social History   Tobacco Use  . Smoking status: Current Every Day Smoker  . Smokeless tobacco: Never Used  Substance Use Topics  . Alcohol use: Yes  . Drug use: No    Home Medications Prior to Admission medications   Medication Sig Start Date End Date Taking? Authorizing Provider  benzonatate (TESSALON) 100 MG capsule Take 1 capsule (100 mg total) by mouth every 8 (eight) hours. 04/02/19   Nuala Alpha A, PA-C  Lidocaine, Anorectal, 5 % CREA Apply to perianal region as needed for discomfort. 12/28/16   [provider]    Allergies    Patient has no known allergies.  Review of Systems   Review of Systems  Respiratory: Positive for cough.   All other systems  reviewed and are negative.   Physical Exam Updated Vital Signs BP 124/87 (BP Location: Right Arm)   Pulse 81   Temp 98.2 F (36.8 C) (Oral)   Resp 18   Ht 5\' 2"  (1.575 m)   Wt 77.1 kg   SpO2 98%   BMI 31.09 kg/m   Physical Exam Vitals and nursing note reviewed.  Constitutional:      General: She is not in acute distress.    Appearance: She is well-developed. She is not diaphoretic.  HENT:     Head: Normocephalic and atraumatic.     Mouth/Throat:     Mouth: Mucous membranes are moist.     Pharynx: No oropharyngeal exudate or posterior oropharyngeal erythema.  Cardiovascular:     Rate and Rhythm: Normal rate and regular rhythm.     Heart sounds: No murmur. No friction rub. No gallop.   Pulmonary:     Effort: Pulmonary effort is normal. No respiratory distress.     Breath sounds: Normal breath sounds. No wheezing.  Abdominal:     General: Bowel sounds are normal. There is no distension.     Palpations: Abdomen is soft.     Tenderness: There is no abdominal tenderness.  Musculoskeletal:        General: Normal range of motion.     Cervical back: Normal range of motion and neck supple. No rigidity or tenderness.  Lymphadenopathy:  Cervical: No cervical adenopathy.  Skin:    General: Skin is warm and dry.  Neurological:     Mental Status: She is alert and oriented to person, place, and time.     ED Results / Procedures / Treatments   Labs (all labs ordered are listed, but only abnormal results are displayed) Labs Reviewed - No data to display  EKG None  Radiology No results found.  Procedures Procedures (including critical care time)  Medications Ordered in ED Medications - No data to display  ED Course  I have reviewed the triage vital signs and the nursing notes.  Pertinent labs & imaging results that were available during my care of the patient were reviewed by me and considered in my medical decision making (see chart for details).    MDM  Rules/Calculators/A&P  Symptoms most likely viral in nature.  Advised over-the-counter medications, rest, fluids, and return as needed.  Final Clinical Impression(s) / ED Diagnoses Final diagnoses:  None    Rx / DC Orders ED Discharge Orders    None       Geoffery Lyons, MD 07/11/19 1043

## 2019-07-11 NOTE — ED Triage Notes (Signed)
Cough and sore throat for several days.

## 2020-09-20 ENCOUNTER — Emergency Department (HOSPITAL_BASED_OUTPATIENT_CLINIC_OR_DEPARTMENT_OTHER)
Admission: EM | Admit: 2020-09-20 | Discharge: 2020-09-20 | Disposition: A | Discharge status: Home / Self Care | Payer: Managed Care, Other (non HMO) | Attending: Emergency Medicine | Admitting: Emergency Medicine

## 2020-09-20 ENCOUNTER — Other Ambulatory Visit: Payer: Self-pay

## 2020-09-20 ENCOUNTER — Encounter (HOSPITAL_BASED_OUTPATIENT_CLINIC_OR_DEPARTMENT_OTHER): Payer: Self-pay | Admitting: Emergency Medicine

## 2020-09-20 DIAGNOSIS — F17200 Nicotine dependence, unspecified, uncomplicated: Secondary | ICD-10-CM | POA: Diagnosis not present

## 2020-09-20 DIAGNOSIS — H5789 Other specified disorders of eye and adnexa: Secondary | ICD-10-CM | POA: Diagnosis present

## 2020-09-20 DIAGNOSIS — H02846 Edema of left eye, unspecified eyelid: Secondary | ICD-10-CM

## 2020-09-20 MED ORDER — LORATADINE 10 MG PO TABS: 10.0000 mg | ORAL_TABLET | Freq: Every day | ORAL | 0 refills | Status: DC

## 2020-09-20 NOTE — ED Triage Notes (Signed)
Pt presents to ED POV. Pt c/o swelling and itching to L eye lid. Pt reports that it began 2w ago. Allergic to pollen.

## 2020-09-20 NOTE — Discharge Instructions (Addendum)
Follow-up with your doctor in the next 1 to 2 weeks if not improving.  Return to the department with worsening pain, swelling or redness around the eye.

## 2020-09-20 NOTE — ED Provider Notes (Signed)
[facility] EMERGENCY DEPARTMENT Provider Note   CSN: [id number] Arrival date & time: 09/20/20  1701     History Chief Complaint  Patient presents with   Facial Swelling    Desiree House is a 30 y.o. female.  Patient presents the emergency department for evaluation of left upper eyelid swelling.  She states that her symptoms started about a week ago.  She applied ice with improvement.  This morning she awoke and felt that her upper eyelid was itchy and slightly swollen.  She did not wanted to get swollen again so she presents to the emergency department.  She states that she has been working outside recently.  No other obvious allergic triggers.  No URI symptoms, eye redness, vision change.  No injury.      History reviewed. No pertinent past medical history.  There are no problems to display for this patient.   Past Surgical History:  Procedure Laterality Date   WISDOM TOOTH EXTRACTION       OB History   No obstetric history on file.     History reviewed. No pertinent family history.  Social History   Tobacco Use   Smoking status: Every Day   Smokeless tobacco: Never  Vaping Use   Vaping Use: Former  Substance Use Topics   Alcohol use: Yes   Drug use: No    Home Medications Prior to Admission medications   Medication Sig Start Date End Date Taking? Authorizing Provider  loratadine (CLARITIN) 10 MG tablet Take 1 tablet (10 mg total) by mouth daily. 09/20/20  Yes Renne Crigler, PA-C  benzonatate (TESSALON) 100 MG capsule Take 1 capsule (100 mg total) by mouth every 8 (eight) hours. 04/02/19   Harlene Salts A, PA-C  Lidocaine, Anorectal, 5 % CREA Apply to perianal region as needed for discomfort. 12/28/16   [provider]    Allergies    Patient has no known allergies.  Review of Systems   Review of Systems  Constitutional:  Negative for fever.  HENT:  Negative for congestion and rhinorrhea.   Eyes:  Positive for itching. Negative  for photophobia, pain, discharge, redness and visual disturbance.   Physical Exam Updated Vital Signs BP 117/81 (BP Location: Right Arm)   Pulse 72   Temp 98.2 F (36.8 C) (Oral)   Resp 18   Ht 5\' 3"  (1.6 m)   Wt 72.6 kg   SpO2 100%   BMI 28.34 kg/m   Physical Exam Vitals and nursing note reviewed.  Constitutional:      Appearance: She is well-developed.  HENT:     Head: Normocephalic and atraumatic.  Eyes:     Conjunctiva/sclera: Conjunctivae normal.     Comments: Trace left upper eyelid edema without cysts or other lesions.  Pulmonary:     Effort: No respiratory distress.  Musculoskeletal:     Cervical back: Normal range of motion and neck supple.  Skin:    General: Skin is warm and dry.  Neurological:     Mental Status: She is alert.    ED Results / Procedures / Treatments   Labs (all labs ordered are listed, but only abnormal results are displayed) Labs Reviewed - No data to display  EKG None  Radiology No results found.  Procedures Procedures   Medications Ordered in ED Medications - No data to display  ED Course  I have reviewed the triage vital signs and the nursing notes.  Pertinent labs & imaging results that were available during  my care of the patient were reviewed by me and considered in my medical decision making (see chart for details).  Patient seen and examined.   Encouraged warm compresses, antihistamines as needed.  Otherwise, follow-up with PCP.   Vital signs reviewed and are as follows: BP 117/81 (BP Location: Right Arm)   Pulse 72   Temp 98.2 F (36.8 C) (Oral)   Resp 18   Ht 5\' 3"  (1.6 m)   Wt 72.6 kg   SpO2 100%   BMI 28.34 kg/m      MDM Rules/Calculators/A&P                           Patient with trace left upper eyelid swelling, otherwise benign exam.  Globe appears normal.   Final Clinical Impression(s) / ED Diagnoses Final diagnoses:  Swelling of eyelid, left    Rx / DC Orders ED Discharge Orders           Ordered    loratadine (CLARITIN) 10 MG tablet  Daily        09/20/20 1723             09/22/20, PA-C 09/20/20 1727    09/22/20, MD 09/20/20 1958

## 2021-01-29 ENCOUNTER — Encounter (HOSPITAL_BASED_OUTPATIENT_CLINIC_OR_DEPARTMENT_OTHER): Payer: Self-pay | Admitting: *Deleted

## 2021-01-29 ENCOUNTER — Other Ambulatory Visit: Payer: Self-pay

## 2021-01-29 DIAGNOSIS — F17200 Nicotine dependence, unspecified, uncomplicated: Secondary | ICD-10-CM | POA: Insufficient documentation

## 2021-01-29 DIAGNOSIS — K0889 Other specified disorders of teeth and supporting structures: Secondary | ICD-10-CM | POA: Diagnosis present

## 2021-01-29 DIAGNOSIS — K029 Dental caries, unspecified: Secondary | ICD-10-CM | POA: Diagnosis not present

## 2021-01-29 NOTE — ED Triage Notes (Signed)
Dental pain x " months"

## 2021-01-30 ENCOUNTER — Emergency Department (HOSPITAL_BASED_OUTPATIENT_CLINIC_OR_DEPARTMENT_OTHER)
Admission: EM | Admit: 2021-01-30 | Discharge: 2021-01-30 | Disposition: A | Discharge status: Home / Self Care | Payer: Managed Care, Other (non HMO) | Attending: Emergency Medicine | Admitting: Emergency Medicine

## 2021-01-30 DIAGNOSIS — K029 Dental caries, unspecified: Secondary | ICD-10-CM

## 2021-01-30 MED ORDER — AMOXICILLIN 500 MG PO CAPS: 500.0000 mg | ORAL_CAPSULE | Freq: Three times a day (TID) | ORAL | 0 refills | Status: DC

## 2021-01-30 MED ORDER — NAPROXEN 250 MG PO TABS
500.0000 mg | ORAL_TABLET | Freq: Once | ORAL | Status: AC
  Administered 2021-01-30: 02:00:00 500 mg via ORAL
  Filled 2021-01-30: qty 2

## 2021-01-30 MED ORDER — AMOXICILLIN 500 MG PO CAPS
500.0000 mg | ORAL_CAPSULE | Freq: Once | ORAL | Status: AC
Start: 2021-01-30 — End: 2021-01-30
  Administered 2021-01-30: 02:00:00 500 mg via ORAL
  Filled 2021-01-30: qty 1

## 2021-01-30 MED ORDER — FLUCONAZOLE 150 MG PO TABS: ORAL_TABLET | ORAL | 0 refills | Status: DC

## 2021-01-30 MED ORDER — NAPROXEN 375 MG PO TABS: ORAL_TABLET | ORAL | 0 refills | Status: DC

## 2021-01-30 NOTE — ED Provider Notes (Signed)
[facility] Provider Note: Desiree Dell, MD, FACEP  CSN: [id number] MRN: [id number] ARRIVAL: 01/29/21 at 2307 ROOM: [facility]   CHIEF COMPLAINT  Dental Pain   HISTORY OF PRESENT ILLNESS  01/30/21 1:22 AM Desiree House is a 30 y.o. female who has had pain associated with her left lower second molar for several months.  This is worsened over the last 2 or 3 days with associated adjacent soft tissue swelling.  She now rates the pain as a 7 out of 10, worse with eating or drinking.  The pain radiates to the left side of her head.  She has been taking Tylenol without adequate relief.  She plans to see her dentist after the first of January when her insurance kicks in.   History reviewed. No pertinent past medical history.  Past Surgical History:  Procedure Laterality Date   WISDOM TOOTH EXTRACTION      No family history on file.  Social History   Tobacco Use   Smoking status: Every Day   Smokeless tobacco: Never  Vaping Use   Vaping Use: Former  Substance Use Topics   Alcohol use: Yes   Drug use: No    Prior to Admission medications   Medication Sig Start Date End Date Taking? Authorizing Provider  amoxicillin (AMOXIL) 500 MG capsule Take 1 capsule (500 mg total) by mouth 3 (three) times daily. 01/30/21  Yes Leslie Jester, MD  naproxen (NAPROSYN) 375 MG tablet Take 1 tablet twice daily as needed for pain. 01/30/21  Yes Jalina Blowers, MD  benzonatate (TESSALON) 100 MG capsule Take 1 capsule (100 mg total) by mouth every 8 (eight) hours. 04/02/19   Harlene Salts A, PA-C  Lidocaine, Anorectal, 5 % CREA Apply to perianal region as needed for discomfort. 12/28/16   [provider]  loratadine (CLARITIN) 10 MG tablet Take 1 tablet (10 mg total) by mouth daily. 09/20/20   Renne Crigler, PA-C    Allergies Patient has no known allergies.   REVIEW OF SYSTEMS  Negative except as noted here or in the History of Present Illness.   PHYSICAL EXAMINATION   Initial Vital Signs Blood pressure 127/81, pulse 65, temperature 98.1 F (36.7 C), temperature source Oral, resp. rate 16, height 5\' 3"  (1.6 m), weight 74.8 kg, SpO2 100 %.  Examination General: Well-developed, well-nourished female in no acute distress; appearance consistent with age of record HENT: normocephalic; atraumatic; carious left lower second molar with mild adjacent soft tissue swelling Eyes: Normal appearance Neck: supple Heart: regular rate and rhythm Lungs: clear to auscultation bilaterally Abdomen: soft; nondistended; nontender; bowel sounds present Extremities: No deformity; full range of motion Neurologic: Awake, alert and oriented; motor function intact in all extremities and symmetric; no facial droop Skin: Warm and dry Psychiatric: Normal mood and affect   RESULTS  Summary of this visit's results, reviewed and interpreted by myself:   EKG Interpretation  Date/Time:    Ventricular Rate:    PR Interval:    QRS Duration:   QT Interval:    QTC Calculation:   R Axis:     Text Interpretation:         Laboratory Studies: No results found for this or any previous visit (from the past 24 hour(s)). Imaging Studies: No results found.  ED COURSE and MDM  Nursing notes, initial and subsequent vitals signs, including pulse oximetry, reviewed and interpreted by myself.  Vitals:   01/29/21 2316 01/29/21 2318  BP:  127/81  Pulse:  65  Resp:  16  Temp:  98.1 F (36.7 C)  TempSrc:  Oral  SpO2:  100%  Weight: 74.8 kg   Height: 5\' 3"  (1.6 m)    Medications  amoxicillin (AMOXIL) capsule 500 mg (has no administration in time range)  naproxen (NAPROSYN) tablet 500 mg (has no administration in time range)   We will start the patient on amoxicillin or Pen-Vee K for likely dental infection.  She was encouraged to follow-up with her dentist promptly.   PROCEDURES  Procedures   ED DIAGNOSES     ICD-10-CM   1. Pain due to dental caries  K02.9           Xiana Carns, MD 01/30/21 [phone]

## 2021-08-21 DIAGNOSIS — Z0389 Encounter for observation for other suspected diseases and conditions ruled out: Secondary | ICD-10-CM | POA: Diagnosis not present

## 2021-08-21 DIAGNOSIS — Z1388 Encounter for screening for disorder due to exposure to contaminants: Secondary | ICD-10-CM | POA: Diagnosis not present

## 2021-08-21 DIAGNOSIS — Z114 Encounter for screening for human immunodeficiency virus [HIV]: Secondary | ICD-10-CM | POA: Diagnosis not present

## 2021-08-21 DIAGNOSIS — Z3046 Encounter for surveillance of implantable subdermal contraceptive: Secondary | ICD-10-CM | POA: Diagnosis not present

## 2021-08-21 DIAGNOSIS — Z01419 Encounter for gynecological examination (general) (routine) without abnormal findings: Secondary | ICD-10-CM | POA: Diagnosis not present

## 2021-08-21 DIAGNOSIS — Z113 Encounter for screening for infections with a predominantly sexual mode of transmission: Secondary | ICD-10-CM | POA: Diagnosis not present

## 2021-08-21 DIAGNOSIS — Z3009 Encounter for other general counseling and advice on contraception: Secondary | ICD-10-CM | POA: Diagnosis not present

## 2021-09-19 IMAGING — DX DG CHEST 1V PORT
1 series · 1 of 1 positions shown · non-contrast
Comparison: January 18, 2017.

CLINICAL DATA: Cough.

EXAM:
PORTABLE CHEST 1 VIEW

[chest ap]
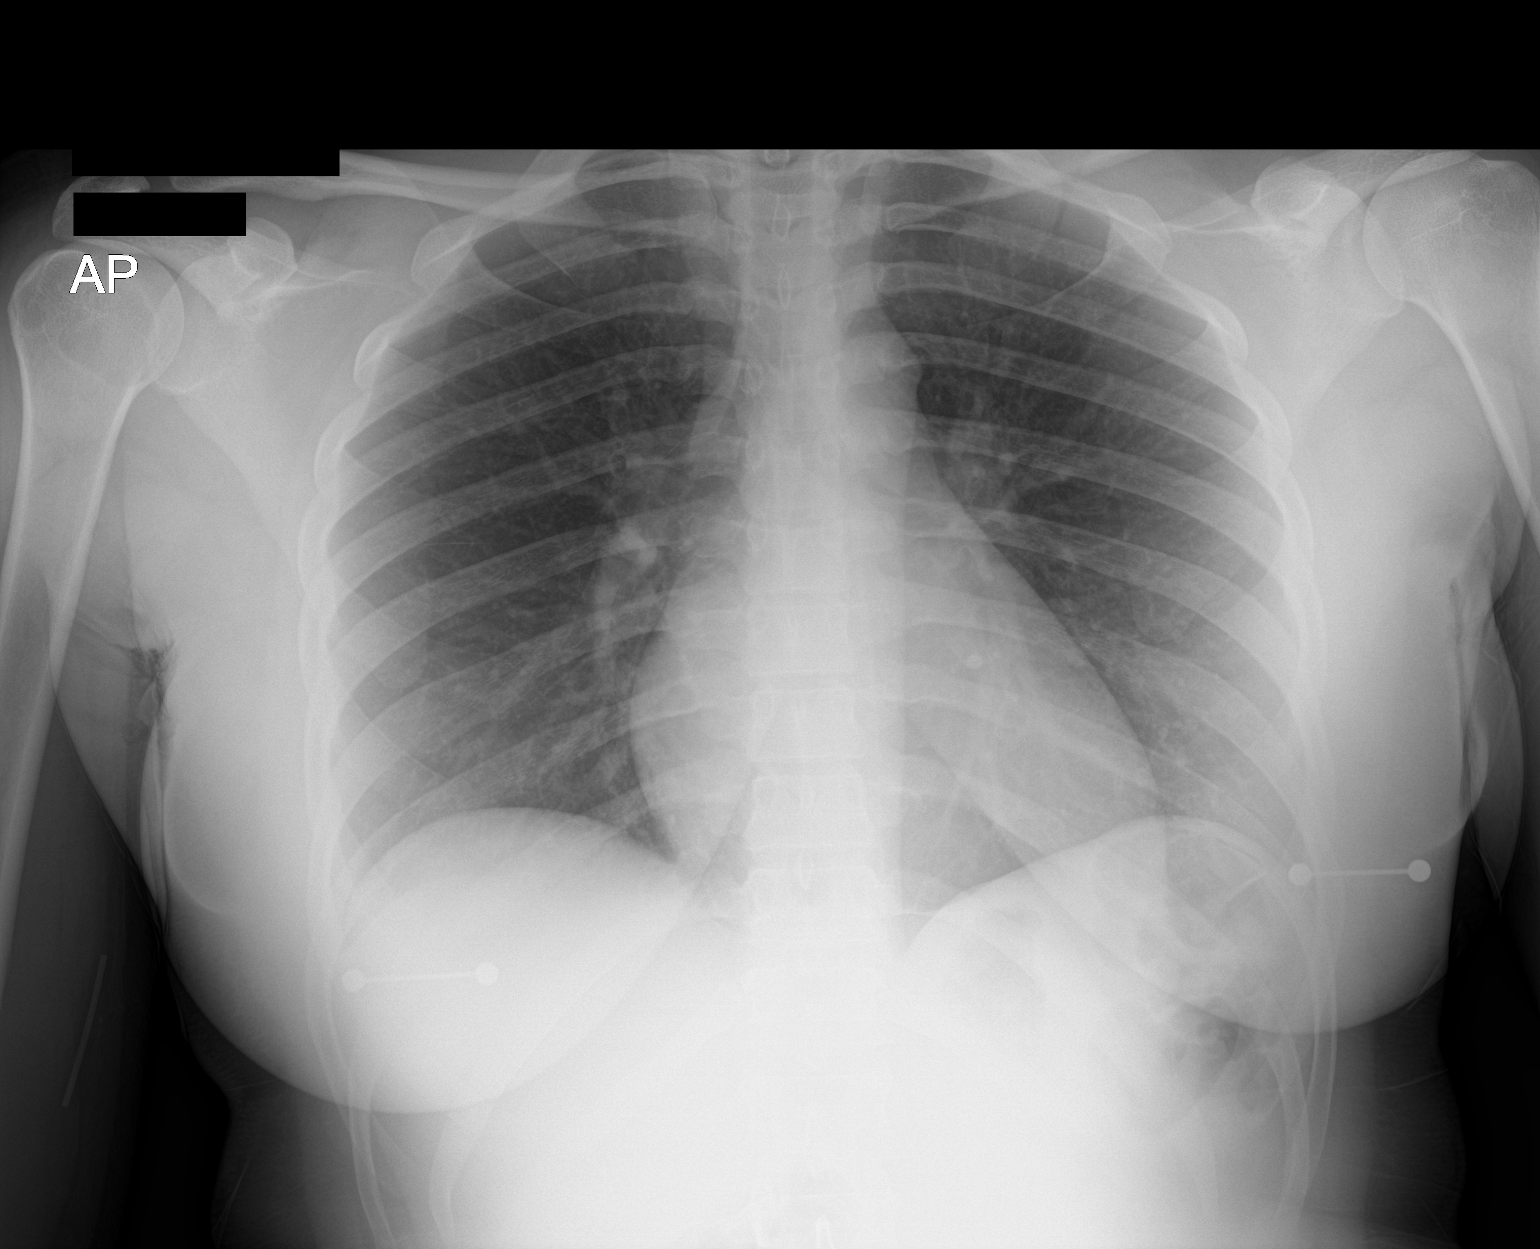

[1 of 1 positions shown; findings below may reference images not displayed]

FINDINGS: The heart size and mediastinal contours are within normal limits.
Both lungs are clear. The visualized skeletal structures are
unremarkable.
IMPRESSION: No active disease.

## 2021-10-21 DIAGNOSIS — Z3046 Encounter for surveillance of implantable subdermal contraceptive: Secondary | ICD-10-CM | POA: Diagnosis not present

## 2021-10-21 DIAGNOSIS — Z3202 Encounter for pregnancy test, result negative: Secondary | ICD-10-CM | POA: Diagnosis not present

## 2021-12-18 ENCOUNTER — Emergency Department (HOSPITAL_BASED_OUTPATIENT_CLINIC_OR_DEPARTMENT_OTHER)
Admission: EM | Admit: 2021-12-18 | Discharge: 2021-12-18 | Disposition: A | Discharge status: Home / Self Care | Payer: Managed Care, Other (non HMO) | Attending: Emergency Medicine | Admitting: Emergency Medicine

## 2021-12-18 ENCOUNTER — Other Ambulatory Visit: Payer: Self-pay

## 2021-12-18 ENCOUNTER — Emergency Department (HOSPITAL_BASED_OUTPATIENT_CLINIC_OR_DEPARTMENT_OTHER): Payer: Managed Care, Other (non HMO)

## 2021-12-18 DIAGNOSIS — S6992XA Unspecified injury of left wrist, hand and finger(s), initial encounter: Secondary | ICD-10-CM | POA: Diagnosis present

## 2021-12-18 DIAGNOSIS — S60012A Contusion of left thumb without damage to nail, initial encounter: Secondary | ICD-10-CM

## 2021-12-18 DIAGNOSIS — W232XXA Caught, crushed, jammed or pinched between a moving and stationary object, initial encounter: Secondary | ICD-10-CM | POA: Insufficient documentation

## 2021-12-18 MED ORDER — IBUPROFEN 800 MG PO TABS: 800.0000 mg | ORAL_TABLET | Freq: Three times a day (TID) | ORAL | 0 refills | Status: DC | PRN

## 2021-12-18 NOTE — ED Triage Notes (Signed)
Pt reports shutting left thumb in car door last night. Pt reports swelling and pain to thumb and hand.

## 2021-12-18 NOTE — ED Provider Notes (Signed)
Assumed care from Dr. Jacqulyn Bath.  X-rays have returned and did not show evidence of fracture.  Informed patient and offered splint for her thumb which she declined.  Discussed symptomatic treatment with her prior to discharge.   Rondel Baton, MD 12/18/21 [phone]

## 2021-12-18 NOTE — ED Notes (Signed)
Discharge instructions reviewed by EDP

## 2021-12-18 NOTE — Discharge Instructions (Signed)
You were seen in the emergency department today with bruising to the left finger.  X-ray does not show any broken bones.  You may apply ice to help reduce swelling.  You may take ibuprofen for pain.  Return with any new or suddenly worsening symptoms.

## 2021-12-18 NOTE — ED Provider Notes (Signed)
Emergency Department Provider Note   I have reviewed the triage vital signs and the nursing notes.   HISTORY  Chief Complaint Finger Injury   HPI Desiree House is a 31 y.o. female presents to the emergency department for evaluation of left and index finger pain after closing these in the car door last night.  She is left-hand dominant.  She has noticed pain mainly over the knuckle on the left thumb with some bruising under the fingernail of the thumb as well.  Some swelling and pain noted to the left index finger as well.    No past medical history on file.  Review of Systems  Constitutional: No fever/chills Cardiovascular: Denies chest pain. Respiratory: Denies shortness of breath. Gastrointestinal: No abdominal pain.   Genitourinary: Negative for dysuria. Musculoskeletal: Left hand pain/swelling.   ____________________________________________   PHYSICAL EXAM:  VITAL SIGNS: ED Triage Vitals  Enc Vitals Group     BP 12/18/21 0618 123/81     Pulse Rate 12/18/21 0618 67     Resp 12/18/21 0618 16     Temp 12/18/21 0618 98.9 F (37.2 C)     Temp src --      SpO2 12/18/21 0618 100 %   Constitutional: Alert and oriented. Well appearing and in no acute distress. Eyes: Conjunctivae are normal.  Head: Atraumatic. Nose: No congestion/rhinnorhea. Mouth/Throat: Mucous membranes are moist.   Neck: No stridor.   Cardiovascular: Good peripheral circulation.  Respiratory: Normal respiratory effort.   Gastrointestinal: No distention.  Musculoskeletal: Tenderness to palpation over the distal left thumb.  Approximately 20% subungual hematoma noted to the thumb.  Motion of the remaining digits of the left hand. Neurologic:  Normal speech and language. Normal sensation to the left thumb.  Skin:  Skin is warm, dry and intact. No rash noted.  ____________________________________________  RADIOLOGY  DG Hand Complete Left  Result Date: 12/18/2021 CLINICAL DATA:  Shut left  thumb in a car door.  Swelling and pain. EXAM: LEFT HAND - COMPLETE 3+ VIEW COMPARISON:  None Available. FINDINGS: There is no evidence of fracture or dislocation. There is no evidence of arthropathy or other focal bone abnormality. There is mild swelling of the thumb. IMPRESSION: Mild swelling of the thumb. No evidence for fracture or dislocation. Electronically Signed   By: Almira Bar M.D.   On: 12/18/2021 07:00    ____________________________________________   PROCEDURES  Procedure(s) performed:   Procedures  None  ____________________________________________   INITIAL IMPRESSION / ASSESSMENT AND PLAN / ED COURSE  Pertinent labs & imaging results that were available during my care of the patient were reviewed by me and considered in my medical decision making (see chart for details).   This patient is Presenting for Evaluation of hand injury, which does require a range of treatment options, and is a complaint that involves a moderate risk of morbidity and mortality.  The Differential Diagnoses include fracture, dislocation, contusion, ligament strain, etc.  Radiologic Tests Ordered, included thumb x-ray. I independently interpreted the images and agree with radiology interpretation.   Medical Decision Making: Summary:  Patient presents to the emergency department left thumb pain and swelling after closing it in the car door.  She has an approximately 20% subungual hematoma.  Considered trephination but most of the pain seems over the IP joint.   No fracture on x-ray. Plan for d/c.   Disposition: Discharge  ____________________________________________  FINAL CLINICAL IMPRESSION(S) / ED DIAGNOSES  Final diagnoses:  Contusion of left thumb without damage  to nail, initial encounter     NEW OUTPATIENT MEDICATIONS STARTED DURING THIS VISIT:  Discharge Medication List as of 12/18/2021  7:07 AM     START taking these medications   Details  ibuprofen (ADVIL) 800 MG  tablet Take 1 tablet (800 mg total) by mouth every 8 (eight) hours as needed for moderate pain., Starting Fri 12/18/2021, Normal        Note:  This document was prepared using Dragon voice recognition software and may include unintentional dictation errors.  Alona Bene, MD, [facility]    Napolean Sia, Arlyss Repress, MD 12/18/21 2312

## 2021-12-19 ENCOUNTER — Encounter (HOSPITAL_BASED_OUTPATIENT_CLINIC_OR_DEPARTMENT_OTHER): Payer: Self-pay | Admitting: Emergency Medicine

## 2021-12-19 ENCOUNTER — Emergency Department (HOSPITAL_BASED_OUTPATIENT_CLINIC_OR_DEPARTMENT_OTHER)
Admission: EM | Admit: 2021-12-19 | Discharge: 2021-12-19 | Disposition: A | Discharge status: Home / Self Care | Payer: Managed Care, Other (non HMO) | Attending: Emergency Medicine | Admitting: Emergency Medicine

## 2021-12-19 DIAGNOSIS — W230XXA Caught, crushed, jammed, or pinched between moving objects, initial encounter: Secondary | ICD-10-CM | POA: Insufficient documentation

## 2021-12-19 DIAGNOSIS — S60112A Contusion of left thumb with damage to nail, initial encounter: Secondary | ICD-10-CM

## 2021-12-19 MED ORDER — LIDOCAINE HCL 2 % IJ SOLN
10.0000 mL | Freq: Once | INTRAMUSCULAR | Status: DC
  Filled 2021-12-19: qty 20

## 2021-12-19 NOTE — ED Provider Notes (Signed)
[facility] EMERGENCY DEPARTMENT  Provider Note  CSN: [id number] Arrival date & time: 12/19/21 0103  History Chief Complaint  Patient presents with   Hand Pain    Desiree House is a 31 y.o. female presents for re-evaluation of L thumb pain after having it slammed in a car door >24 hours ago. She was seen in the ED yesterday morning with negative xrays. She was noted to have subungual hematoma but they decided not to do trephination at that time as her pain seemed to be more localized to the DIP joint. She returns with continued pain and would like to try trephination.    Home Medications Prior to Admission medications   Medication Sig Start Date End Date Taking? Authorizing Provider  amoxicillin (AMOXIL) 500 MG capsule Take 1 capsule (500 mg total) by mouth 3 (three) times daily. 01/30/21   Molpus, John, MD  benzonatate (TESSALON) 100 MG capsule Take 1 capsule (100 mg total) by mouth every 8 (eight) hours. 04/02/19   Harlene Salts A, PA-C  fluconazole (DIFLUCAN) 150 MG tablet Take 1 tablet as needed for vaginal yeast infection.  May repeat in 3 days if symptoms persist. 01/30/21   Molpus, Jonny Ruiz, MD  ibuprofen (ADVIL) 800 MG tablet Take 1 tablet (800 mg total) by mouth every 8 (eight) hours as needed for moderate pain. 12/18/21   Long, Arlyss Repress, MD  Lidocaine, Anorectal, 5 % CREA Apply to perianal region as needed for discomfort. 12/28/16   [provider]  loratadine (CLARITIN) 10 MG tablet Take 1 tablet (10 mg total) by mouth daily. 09/20/20   Renne Crigler, PA-C  naproxen (NAPROSYN) 375 MG tablet Take 1 tablet twice daily as needed for pain. 01/30/21   Molpus, Jonny Ruiz, MD     Allergies    Patient has no known allergies.   Review of Systems   Review of Systems Please see HPI for pertinent positives and negatives  Physical Exam BP 130/88 (BP Location: Right Arm)   Pulse 65   Temp 98.2 F (36.8 C) (Oral)   Resp 16   Ht 5\' 3"  (1.6 m)   Wt 72.6 kg   SpO2  99%   BMI 28.34 kg/m   Physical Exam Vitals and nursing note reviewed.  HENT:     Head: Normocephalic.     Nose: Nose normal.  Eyes:     Extraocular Movements: Extraocular movements intact.  Pulmonary:     Effort: Pulmonary effort is normal.  Musculoskeletal:     Cervical back: Neck supple.     Comments: Moderate subungual hematoma L thumb, no other swelling or deformity, no bony tenderness  Skin:    Findings: No rash (on exposed skin).  Neurological:     Mental Status: She is alert and oriented to person, place, and time.  Psychiatric:        Mood and Affect: Mood normal.     ED Results / Procedures / Treatments   EKG None  Procedures .Nail Removal  Date/Time: 12/19/2021 3:47 AM  Performed by: 12/21/2021, MD Authorized by: Pollyann Savoy, MD   Consent:    Consent obtained:  Verbal   Consent given by:  Patient Universal protocol:    Patient identity confirmed:  Verbally with patient Location:    Hand:  L thumb Pre-procedure details:    Skin preparation:  Povidone-iodine Anesthesia:    Anesthesia method:  Nerve block   Block location:  Digital   Block anesthetic:  Lidocaine 2% w/o  epi Trephination:    Subungual hematoma drained: yes     Trephination instrument:  Cautery Post-procedure details:    Dressing: bandaid.   Procedure completion:  Tolerated well, no immediate complications   Medications Ordered in the ED Medications  lidocaine (XYLOCAINE) 2 % (with pres) injection 200 mg (has no administration in time range)    Initial Impression and Plan  Patient with persistent pain from subungual hematoma. Finger was anesthetized and hematoma drained by trephination as above with good relief in pain. Advised to keep wounds clean, elevate hand above level of heart. PCP follouwp.   ED Course       MDM Rules/Calculators/A&P Medical Decision Making Problems Addressed: Subungual hematoma of left thumb, initial encounter: acute illness or  injury  Risk Prescription drug management.    Final Clinical Impression(s) / ED Diagnoses Final diagnoses:  Subungual hematoma of left thumb, initial encounter    Rx / DC Orders ED Discharge Orders     None        Pollyann Savoy, MD 12/19/21 [phone]

## 2021-12-19 NOTE — ED Triage Notes (Signed)
Pt states she shut her left thumb in a car door yesterday  Pt is c/o pain  Pt was seen here yesterday for same  Pt wants the nail drained

## 2022-01-19 NOTE — Progress Notes (Unsigned)
   New Patient Office Visit  Subjective    Patient ID: Desiree House, female    DOB: 1990-09-05  Age: 31 y.o. MRN: [id number]  CC: No chief complaint on file.   HPI Desiree House presents to establish care ***  Outpatient Encounter Medications as of 01/20/2022  Medication Sig   [DISCONTINUED] amoxicillin (AMOXIL) 500 MG capsule Take 1 capsule (500 mg total) by mouth 3 (three) times daily.   [DISCONTINUED] benzonatate (TESSALON) 100 MG capsule Take 1 capsule (100 mg total) by mouth every 8 (eight) hours.   [DISCONTINUED] fluconazole (DIFLUCAN) 150 MG tablet Take 1 tablet as needed for vaginal yeast infection.  May repeat in 3 days if symptoms persist.   [DISCONTINUED] ibuprofen (ADVIL) 800 MG tablet Take 1 tablet (800 mg total) by mouth every 8 (eight) hours as needed for moderate pain.   [DISCONTINUED] Lidocaine, Anorectal, 5 % CREA Apply to perianal region as needed for discomfort.   [DISCONTINUED] loratadine (CLARITIN) 10 MG tablet Take 1 tablet (10 mg total) by mouth daily.   [DISCONTINUED] naproxen (NAPROSYN) 375 MG tablet Take 1 tablet twice daily as needed for pain.   No facility-administered encounter medications on file as of 01/20/2022.    No past medical history on file.  Past Surgical History:  Procedure Laterality Date   WISDOM TOOTH EXTRACTION      Family History  Problem Relation Age of Onset   Diabetes Mother    CAD Mother    CAD Other    Diabetes Other     Social History   Socioeconomic History   Marital status: Single    Spouse name: Not on file   Number of children: Not on file   Years of education: Not on file   Highest education level: Not on file  Occupational History   Not on file  Tobacco Use   Smoking status: Every Day    Types: Cigars   Smokeless tobacco: Never  Vaping Use   Vaping Use: Former  Substance and Sexual Activity   Alcohol use: Yes    Comment: rrare   Drug use: No   Sexual activity: Yes    Birth control/protection:  Implant  Other Topics Concern   Not on file  Social History Narrative   Not on file   Social Determinants of Health   Financial Resource Strain: Not on file  Food Insecurity: Not on file  Transportation Needs: Not on file  Physical Activity: Not on file  Stress: Not on file  Social Connections: Not on file  Intimate Partner Violence: Not on file    ROS      Objective    There were no vitals taken for this visit.  Physical Exam  {Labs (Optional):23779}    Assessment & Plan:   Problem List Items Addressed This Visit   None   No follow-ups on file.   Desiree Slick, MD

## 2022-01-20 ENCOUNTER — Ambulatory Visit (INDEPENDENT_AMBULATORY_CARE_PROVIDER_SITE_OTHER): Payer: Managed Care, Other (non HMO)

## 2022-01-20 ENCOUNTER — Encounter: Payer: Self-pay | Admitting: Family Medicine

## 2022-01-20 ENCOUNTER — Ambulatory Visit (INDEPENDENT_AMBULATORY_CARE_PROVIDER_SITE_OTHER): Payer: Managed Care, Other (non HMO) | Admitting: Family Medicine

## 2022-01-20 VITALS — BP 125/74 | HR 81 | Resp 16 | Wt 165.8 lb

## 2022-01-20 DIAGNOSIS — R7302 Impaired glucose tolerance (oral): Secondary | ICD-10-CM

## 2022-01-20 DIAGNOSIS — Z136 Encounter for screening for cardiovascular disorders: Secondary | ICD-10-CM

## 2022-01-20 DIAGNOSIS — S6702XD Crushing injury of left thumb, subsequent encounter: Secondary | ICD-10-CM | POA: Diagnosis not present

## 2022-01-20 DIAGNOSIS — Z13220 Encounter for screening for lipoid disorders: Secondary | ICD-10-CM

## 2022-01-20 DIAGNOSIS — Z1159 Encounter for screening for other viral diseases: Secondary | ICD-10-CM | POA: Diagnosis not present

## 2022-01-20 DIAGNOSIS — Z7689 Persons encountering health services in other specified circumstances: Secondary | ICD-10-CM

## 2022-01-20 DIAGNOSIS — S6702XS Crushing injury of left thumb, sequela: Secondary | ICD-10-CM | POA: Diagnosis not present

## 2022-01-20 DIAGNOSIS — S6992XA Unspecified injury of left wrist, hand and finger(s), initial encounter: Secondary | ICD-10-CM | POA: Diagnosis not present

## 2022-01-21 ENCOUNTER — Encounter: Payer: Self-pay | Admitting: Family Medicine

## 2022-01-21 LAB — CBC WITH DIFFERENTIAL/PLATELET
Basophils Absolute: 0 10*3/uL (ref 0.0–0.2)
Basos: 0 %
EOS (ABSOLUTE): 0.1 10*3/uL (ref 0.0–0.4)
Eos: 1 %
Hematocrit: 39.9 % (ref 34.0–46.6)
Hemoglobin: 12.6 g/dL (ref 11.1–15.9)
Immature Grans (Abs): 0 10*3/uL (ref 0.0–0.1)
Immature Granulocytes: 0 %
Lymphocytes Absolute: 2.3 10*3/uL (ref 0.7–3.1)
Lymphs: 38 %
MCH: 27.6 pg (ref 26.6–33.0)
MCHC: 31.6 g/dL (ref 31.5–35.7)
MCV: 88 fL (ref 79–97)
Monocytes Absolute: 0.6 10*3/uL (ref 0.1–0.9)
Monocytes: 9 %
Neutrophils Absolute: 3 10*3/uL (ref 1.4–7.0)
Neutrophils: 52 %
Platelets: 198 10*3/uL (ref 150–450)
RBC: 4.56 x10E6/uL (ref 3.77–5.28)
RDW: 13.6 % (ref 11.7–15.4)
WBC: 5.9 10*3/uL (ref 3.4–10.8)

## 2022-01-21 LAB — LIPID PANEL
Chol/HDL Ratio: 2.7 ratio (ref 0.0–4.4)
Cholesterol, Total: 97 mg/dL — ABNORMAL LOW (ref 100–199)
HDL: 36 mg/dL — ABNORMAL LOW (ref >39)
LDL Chol Calc (NIH): 49 mg/dL (ref 0–99)
Triglycerides: 51 mg/dL (ref 0–149)
VLDL Cholesterol Cal: 12 mg/dL (ref 5–40)

## 2022-01-21 LAB — COMPREHENSIVE METABOLIC PANEL
ALT: 16 IU/L (ref 0–32)
AST: 12 IU/L (ref 0–40)
Albumin/Globulin Ratio: 2.2 (ref 1.2–2.2)
Albumin: 4.3 g/dL (ref 4.0–5.0)
Alkaline Phosphatase: 41 IU/L — ABNORMAL LOW (ref 44–121)
BUN/Creatinine Ratio: 14 (ref 9–23)
BUN: 10 mg/dL (ref 6–20)
Bilirubin Total: <0.2 mg/dL (ref 0.0–1.2)
CO2: 21 mmol/L (ref 20–29)
Calcium: 9.2 mg/dL (ref 8.7–10.2)
Chloride: 108 mmol/L — ABNORMAL HIGH (ref 96–106)
Creatinine, Ser: 0.73 mg/dL (ref 0.57–1.00)
Globulin, Total: 2 g/dL (ref 1.5–4.5)
Glucose: 103 mg/dL — ABNORMAL HIGH (ref 70–99)
Potassium: 4.4 mmol/L (ref 3.5–5.2)
Sodium: 143 mmol/L (ref 134–144)
Total Protein: 6.3 g/dL (ref 6.0–8.5)
eGFR: 113 mL/min/{1.73_m2} (ref >59)

## 2022-01-21 LAB — HIV ANTIBODY (ROUTINE TESTING W REFLEX): HIV Screen 4th Generation wRfx: NONREACTIVE

## 2022-01-21 LAB — HEPATITIS C ANTIBODY: Hep C Virus Ab: NONREACTIVE

## 2022-01-21 LAB — HEMOGLOBIN A1C
Est. average glucose Bld gHb Est-mCnc: 126 mg/dL
Hgb A1c MFr Bld: 6 % — ABNORMAL HIGH (ref 4.8–5.6)

## 2022-11-18 ENCOUNTER — Encounter (HOSPITAL_BASED_OUTPATIENT_CLINIC_OR_DEPARTMENT_OTHER): Payer: Self-pay | Admitting: Urology

## 2022-11-18 ENCOUNTER — Emergency Department (HOSPITAL_BASED_OUTPATIENT_CLINIC_OR_DEPARTMENT_OTHER)
Admission: EM | Admit: 2022-11-18 | Discharge: 2022-11-18 | Disposition: A | Discharge status: Home / Self Care | Payer: Medicaid Other

## 2022-11-18 ENCOUNTER — Other Ambulatory Visit (HOSPITAL_BASED_OUTPATIENT_CLINIC_OR_DEPARTMENT_OTHER): Payer: Self-pay

## 2022-11-18 ENCOUNTER — Emergency Department (HOSPITAL_BASED_OUTPATIENT_CLINIC_OR_DEPARTMENT_OTHER): Payer: Medicaid Other

## 2022-11-18 DIAGNOSIS — J40 Bronchitis, not specified as acute or chronic: Secondary | ICD-10-CM | POA: Diagnosis not present

## 2022-11-18 DIAGNOSIS — Z1152 Encounter for screening for COVID-19: Secondary | ICD-10-CM | POA: Diagnosis not present

## 2022-11-18 DIAGNOSIS — R059 Cough, unspecified: Secondary | ICD-10-CM | POA: Diagnosis not present

## 2022-11-18 DIAGNOSIS — R051 Acute cough: Secondary | ICD-10-CM

## 2022-11-18 LAB — RESP PANEL BY RT-PCR (RSV, FLU A&B, COVID)  RVPGX2
Influenza A by PCR: NEGATIVE
Influenza B by PCR: NEGATIVE
Resp Syncytial Virus by PCR: NEGATIVE
SARS Coronavirus 2 by RT PCR: NEGATIVE

## 2022-11-18 MED ORDER — ALBUTEROL SULFATE HFA 108 (90 BASE) MCG/ACT IN AERS
2.0000 | INHALATION_SPRAY | RESPIRATORY_TRACT | Status: DC | PRN
  Administered 2022-11-18: 2 via RESPIRATORY_TRACT
  Filled 2022-11-18: qty 6.7

## 2022-11-18 MED ORDER — BENZONATATE 100 MG PO CAPS
100.0000 mg | ORAL_CAPSULE | Freq: Three times a day (TID) | ORAL | 0 refills | Status: AC
  Filled 2022-11-18: qty 15, 5d supply, fill #0

## 2022-11-18 NOTE — Discharge Instructions (Addendum)
Please read and follow all provided instructions.  Your diagnoses today include:  1. Bronchitis   2. Acute cough     Tests performed today include: Chest x-ray - does not show any pneumonia Covid/flu/RSV testing was negative Vital signs. See below for your results today.   Medications prescribed:  Tessalon Perles - cough suppressant medication  Albuterol inhaler - medication that opens up your airway  Use inhaler as follows: 1-2 puffs with spacer every 4 hours as needed for wheezing, cough, or shortness of breath.   Take any prescribed medications only as directed.  Home care instructions:  Follow any educational materials contained in this packet.  Follow-up instructions: Please follow-up with your primary care provider in the next 7 days for further evaluation of your symptoms and a recheck if you are not feeling better.   Return instructions:  Please return to the Emergency Department if you experience worsening symptoms. Please return with worsening wheezing, shortness of breath, or difficulty breathing. Return with persistent fever above 101F.  Please return if you have any other emergent concerns.  Additional Information:  Your vital signs today were: BP 118/81   Pulse 60   Temp 97.7 F (36.5 C)   Resp 16   Ht 5\' 3"  (1.6 m)   Wt 75.2 kg   SpO2 99%   BMI 29.37 kg/m  If your blood pressure (BP) was elevated above 135/85 this visit, please have this repeated by your doctor within one month. --------------

## 2022-11-18 NOTE — ED Provider Notes (Signed)
[facility] Provider Note   CSN: [id number] Arrival date & time: 11/18/22  1233     History  Chief Complaint  Patient presents with   Cough    Desiree House is a 32 y.o. female.  Patient presents to the emergency department for evaluation of cough.  This has been ongoing for about 1 week.  She has had some intermittent wheezing.  She has a history of bronchitis, no history of asthma.  Sometimes when she coughs up sputum, she noticed a couple specks of blood in the sputum, but this is not all the time.  No chest pain including pleuritic chest pain.  No shortness of breath.  No fevers.  She had a family member who was recently sick as well. Patient denies risk factors for pulmonary embolism including: unilateral leg swelling, history of DVT/PE/other blood clots, OCP use, recent immobilizations, recent surgery, recent travel (>4hr segment), malignancy.          Home Medications Prior to Admission medications   Medication Sig Start Date End Date Taking? Authorizing Provider  benzonatate (TESSALON) 100 MG capsule Take 1 capsule (100 mg total) by mouth every 8 (eight) hours. 11/18/22  Yes Renne Crigler, PA-C  Etonogestrel (NEXPLANON [place]) Inject into the skin.    [provider]      Allergies    Patient has no known allergies.    Review of Systems   Review of Systems  Physical Exam Updated Vital Signs BP 118/81   Pulse 60   Temp 97.7 F (36.5 C)   Resp 16   Ht 5\' 3"  (1.6 m)   Wt 75.2 kg   SpO2 98%   BMI 29.37 kg/m  Physical Exam Vitals and nursing note reviewed.  Constitutional:      Appearance: She is well-developed.  HENT:     Head: Normocephalic and atraumatic.     Jaw: No trismus.     Right Ear: Tympanic membrane, ear canal and external ear normal.     Left Ear: Tympanic membrane, ear canal and external ear normal.     Nose: Nose normal. No mucosal edema or rhinorrhea.     Mouth/Throat:     Mouth:  Mucous membranes are moist. Mucous membranes are not dry. No oral lesions.     Pharynx: Uvula midline. Posterior oropharyngeal erythema present. No oropharyngeal exudate or uvula swelling.     Tonsils: No tonsillar abscesses.  Eyes:     General:        Right eye: No discharge.        Left eye: No discharge.     Conjunctiva/sclera: Conjunctivae normal.  Cardiovascular:     Rate and Rhythm: Normal rate and regular rhythm.     Heart sounds: Normal heart sounds.  Pulmonary:     Effort: Pulmonary effort is normal. No respiratory distress.     Breath sounds: Normal breath sounds. No wheezing or rales.     Comments: Occasional cough during exam. Abdominal:     Palpations: Abdomen is soft.     Tenderness: There is no abdominal tenderness.  Musculoskeletal:     Cervical back: Normal range of motion and neck supple.  Lymphadenopathy:     Cervical: No cervical adenopathy.  Skin:    General: Skin is warm and dry.  Neurological:     Mental Status: She is alert.  Psychiatric:        Mood and Affect: Mood normal.     ED  Results / Procedures / Treatments   Labs (all labs ordered are listed, but only abnormal results are displayed) Labs Reviewed  RESP PANEL BY RT-PCR (RSV, FLU A&B, COVID)  [id number]    EKG None  Radiology No results found.  Procedures Procedures    Medications Ordered in ED Medications  albuterol (VENTOLIN HFA) 108 (90 Base) MCG/ACT inhaler 2 puff (2 puffs Inhalation Given 11/18/22 1414)    ED Course/ Medical Decision Making/ A&P    Patient seen and examined. History obtained directly from patient. Work-up including labs, imaging, EKG ordered in triage, if performed, were reviewed.    Labs/EKG: COVID, flu, RSV was negative  Imaging: Independently reviewed and interpreted.  This included: Chest x-ray, no signs of pneumonia or infiltrate  Medications/Fluids: Ordered: Albuterol inhaler to use when wheezing occurs  Most recent vital signs reviewed and are as  follows: BP 118/81   Pulse 60   Temp 97.7 F (36.5 C)   Resp 16   Ht 5\' 3"  (1.6 m)   Wt 75.2 kg   SpO2 98%   BMI 29.37 kg/m   Initial impression: Cough, likely acute bronchitis  Home treatment plan: OTC meds, prescription for Tessalon, albuterol every 4 hours as needed for chest tightness or cough.  Return instructions discussed with patient: Fever, worsening shortness of breath or trouble breathing  Follow-up instructions discussed with patient: PCP in 1 week if not improving                                Medical Decision Making Amount and/or Complexity of Data Reviewed Radiology: ordered.  Risk Prescription drug management.   Patient with ongoing cough, no significant chest pain.  Observed suggestive of viral respiratory infection, likely bronchitis.  Low concern for pneumonia based on x-ray and lack of fever.  No shortness of breath.  No clinical signs and symptoms of DVT and low concern for PE.          Final Clinical Impression(s) / ED Diagnoses Final diagnoses:  Bronchitis  Acute cough    Rx / DC Orders ED Discharge Orders          Ordered    benzonatate (TESSALON) 100 MG capsule  Every 8 hours        11/18/22 1406              Renne Crigler, PA-C 11/18/22 1420    Durwin Glaze, MD 11/18/22 1430

## 2022-11-18 NOTE — ED Triage Notes (Signed)
Pt states concern for URI  Has had productive cough that is waking her up x 1 week  Saw some blood in sputum as well  Denies any fever

## 2022-11-22 NOTE — Plan of Care (Signed)
[facility] Tonsillectomy/Adenoidectomy, Postoperative PEDS care plan entered in error.

## 2022-11-29 ENCOUNTER — Other Ambulatory Visit (HOSPITAL_BASED_OUTPATIENT_CLINIC_OR_DEPARTMENT_OTHER): Payer: Self-pay

## 2023-01-24 ENCOUNTER — Encounter: Payer: Managed Care, Other (non HMO) | Admitting: Family Medicine

## 2023-06-22 DIAGNOSIS — H5213 Myopia, bilateral: Secondary | ICD-10-CM | POA: Diagnosis not present

## 2023-08-24 DIAGNOSIS — Z3046 Encounter for surveillance of implantable subdermal contraceptive: Secondary | ICD-10-CM | POA: Diagnosis not present

## 2023-08-24 DIAGNOSIS — Z3009 Encounter for other general counseling and advice on contraception: Secondary | ICD-10-CM | POA: Diagnosis not present

## 2023-09-29 DIAGNOSIS — Z3046 Encounter for surveillance of implantable subdermal contraceptive: Secondary | ICD-10-CM | POA: Diagnosis not present

## 2023-09-29 DIAGNOSIS — Z3009 Encounter for other general counseling and advice on contraception: Secondary | ICD-10-CM | POA: Diagnosis not present

## 2023-09-29 DIAGNOSIS — Z30011 Encounter for initial prescription of contraceptive pills: Secondary | ICD-10-CM | POA: Diagnosis not present

## 2023-11-24 DIAGNOSIS — M79672 Pain in left foot: Secondary | ICD-10-CM | POA: Diagnosis not present

## 2023-11-24 DIAGNOSIS — M19172 Post-traumatic osteoarthritis, left ankle and foot: Secondary | ICD-10-CM | POA: Diagnosis not present

## 2023-12-16 DIAGNOSIS — N946 Dysmenorrhea, unspecified: Secondary | ICD-10-CM | POA: Diagnosis not present

## 2023-12-21 DIAGNOSIS — M19072 Primary osteoarthritis, left ankle and foot: Secondary | ICD-10-CM | POA: Diagnosis not present

## 2024-01-06 DIAGNOSIS — J029 Acute pharyngitis, unspecified: Secondary | ICD-10-CM | POA: Diagnosis not present

## 2024-01-06 DIAGNOSIS — R059 Cough, unspecified: Secondary | ICD-10-CM | POA: Diagnosis not present

## 2024-01-06 DIAGNOSIS — J019 Acute sinusitis, unspecified: Secondary | ICD-10-CM | POA: Diagnosis not present

## 2024-03-21 ENCOUNTER — Encounter: Admitting: Family Medicine
